# Patient Record
Sex: Male | Born: 1950
Health system: Southern US, Community
[De-identification: ages and names within clinical notes are randomized; demographics above are authoritative.]

## PROBLEM LIST (undated history)

## (undated) DIAGNOSIS — E785 Hyperlipidemia, unspecified: Secondary | ICD-10-CM

## (undated) DIAGNOSIS — I639 Cerebral infarction, unspecified: Secondary | ICD-10-CM

## (undated) DIAGNOSIS — I2510 Atherosclerotic heart disease of native coronary artery without angina pectoris: Secondary | ICD-10-CM

## (undated) DIAGNOSIS — E782 Mixed hyperlipidemia: Secondary | ICD-10-CM

## (undated) DIAGNOSIS — I10 Essential (primary) hypertension: Secondary | ICD-10-CM

## (undated) DIAGNOSIS — Z720 Tobacco use: Secondary | ICD-10-CM

## (undated) DIAGNOSIS — I219 Acute myocardial infarction, unspecified: Secondary | ICD-10-CM

## (undated) DIAGNOSIS — I779 Disorder of arteries and arterioles, unspecified: Secondary | ICD-10-CM

## (undated) DIAGNOSIS — I679 Cerebrovascular disease, unspecified: Secondary | ICD-10-CM

## (undated) HISTORY — DX: Hyperlipidemia, unspecified: E78.5

## (undated) HISTORY — PX: CAROTID ENDARTERECTOMY: SUR193

## (undated) HISTORY — DX: Essential (primary) hypertension: I10

## (undated) HISTORY — DX: Cerebral infarction, unspecified: I63.9

## (undated) HISTORY — DX: Disorder of arteries and arterioles, unspecified: I77.9

## (undated) HISTORY — DX: Cerebrovascular disease, unspecified: I67.9

## (undated) HISTORY — DX: Acute myocardial infarction, unspecified: I21.9

## (undated) HISTORY — DX: Tobacco use: Z72.0

## (undated) HISTORY — DX: Mixed hyperlipidemia: E78.2

## (undated) HISTORY — DX: Atherosclerotic heart disease of native coronary artery without angina pectoris: I25.10

---

## 2002-01-05 ENCOUNTER — Ambulatory Visit (HOSPITAL_BASED_OUTPATIENT_CLINIC_OR_DEPARTMENT_OTHER): Admission: RE | Admit: 2002-01-05 | Discharge: 2002-01-05 | Payer: Self-pay | Admitting: *Deleted

## 2003-08-11 ENCOUNTER — Ambulatory Visit (HOSPITAL_COMMUNITY): Admission: RE | Admit: 2003-08-11 | Discharge: 2003-08-11 | Payer: Self-pay | Admitting: Family Medicine

## 2003-08-16 ENCOUNTER — Ambulatory Visit (HOSPITAL_COMMUNITY): Admission: RE | Admit: 2003-08-16 | Discharge: 2003-08-16 | Payer: Self-pay | Admitting: Family Medicine

## 2003-08-28 ENCOUNTER — Inpatient Hospital Stay (HOSPITAL_COMMUNITY): Admission: AD | Admit: 2003-08-28 | Discharge: 2003-08-31 | Payer: Self-pay | Admitting: Internal Medicine

## 2003-09-28 ENCOUNTER — Inpatient Hospital Stay (HOSPITAL_COMMUNITY): Admission: RE | Admit: 2003-09-28 | Discharge: 2003-09-29 | Payer: Self-pay | Admitting: Vascular Surgery

## 2003-09-28 ENCOUNTER — Encounter (INDEPENDENT_AMBULATORY_CARE_PROVIDER_SITE_OTHER): Payer: Self-pay | Admitting: *Deleted

## 2003-10-31 ENCOUNTER — Observation Stay (HOSPITAL_COMMUNITY): Admission: RE | Admit: 2003-10-31 | Discharge: 2003-11-01 | Payer: Self-pay | Admitting: Vascular Surgery

## 2003-10-31 ENCOUNTER — Encounter (INDEPENDENT_AMBULATORY_CARE_PROVIDER_SITE_OTHER): Payer: Self-pay | Admitting: Specialist

## 2004-08-15 ENCOUNTER — Ambulatory Visit (HOSPITAL_COMMUNITY): Admission: RE | Admit: 2004-08-15 | Discharge: 2004-08-15 | Payer: Self-pay | Admitting: Family Medicine

## 2004-09-10 ENCOUNTER — Ambulatory Visit (HOSPITAL_COMMUNITY): Admission: RE | Admit: 2004-09-10 | Discharge: 2004-09-10 | Payer: Self-pay | Admitting: Orthopedic Surgery

## 2005-05-28 ENCOUNTER — Ambulatory Visit: Payer: Self-pay | Admitting: Cardiology

## 2005-05-28 ENCOUNTER — Inpatient Hospital Stay (HOSPITAL_COMMUNITY): Admission: EM | Admit: 2005-05-28 | Discharge: 2005-05-31 | Payer: Self-pay | Admitting: Cardiology

## 2005-05-29 ENCOUNTER — Ambulatory Visit: Payer: Self-pay | Admitting: Cardiology

## 2005-07-14 ENCOUNTER — Ambulatory Visit: Payer: Self-pay | Admitting: Cardiology

## 2005-09-13 ENCOUNTER — Emergency Department (HOSPITAL_COMMUNITY): Admission: EM | Admit: 2005-09-13 | Discharge: 2005-09-13 | Payer: Self-pay | Admitting: Emergency Medicine

## 2005-10-24 ENCOUNTER — Ambulatory Visit (HOSPITAL_COMMUNITY): Admission: RE | Admit: 2005-10-24 | Discharge: 2005-10-24 | Payer: Self-pay | Admitting: Family Medicine

## 2006-02-26 ENCOUNTER — Ambulatory Visit (HOSPITAL_COMMUNITY): Admission: RE | Admit: 2006-02-26 | Discharge: 2006-02-26 | Payer: Self-pay | Admitting: Family Medicine

## 2006-07-22 ENCOUNTER — Ambulatory Visit: Payer: Self-pay | Admitting: Physician Assistant

## 2006-07-31 ENCOUNTER — Ambulatory Visit: Payer: Self-pay | Admitting: Cardiology

## 2007-04-16 ENCOUNTER — Ambulatory Visit (HOSPITAL_COMMUNITY): Admission: RE | Admit: 2007-04-16 | Discharge: 2007-04-16 | Payer: Self-pay | Admitting: Family Medicine

## 2007-06-01 ENCOUNTER — Encounter: Payer: Self-pay | Admitting: Physician Assistant

## 2007-06-01 ENCOUNTER — Ambulatory Visit: Payer: Self-pay | Admitting: Cardiology

## 2007-06-10 ENCOUNTER — Encounter: Payer: Self-pay | Admitting: Cardiology

## 2008-05-10 ENCOUNTER — Ambulatory Visit: Payer: Self-pay | Admitting: Cardiology

## 2008-05-10 ENCOUNTER — Encounter: Payer: Self-pay | Admitting: Physician Assistant

## 2008-05-25 ENCOUNTER — Encounter: Payer: Self-pay | Admitting: Physician Assistant

## 2008-10-02 ENCOUNTER — Encounter: Payer: Self-pay | Admitting: Cardiology

## 2008-10-02 ENCOUNTER — Inpatient Hospital Stay (HOSPITAL_COMMUNITY): Admission: EM | Admit: 2008-10-02 | Discharge: 2008-10-06 | Payer: Self-pay | Admitting: Internal Medicine

## 2008-10-02 ENCOUNTER — Ambulatory Visit: Payer: Self-pay | Admitting: Cardiovascular Disease

## 2008-10-03 ENCOUNTER — Encounter: Payer: Self-pay | Admitting: Cardiology

## 2008-10-03 ENCOUNTER — Encounter (INDEPENDENT_AMBULATORY_CARE_PROVIDER_SITE_OTHER): Payer: Self-pay | Admitting: *Deleted

## 2008-10-04 ENCOUNTER — Ambulatory Visit: Payer: Self-pay | Admitting: Vascular Surgery

## 2008-10-05 ENCOUNTER — Encounter: Payer: Self-pay | Admitting: Vascular Surgery

## 2008-10-05 ENCOUNTER — Encounter: Payer: Self-pay | Admitting: Cardiology

## 2008-10-24 ENCOUNTER — Ambulatory Visit: Payer: Self-pay | Admitting: Vascular Surgery

## 2009-01-11 DIAGNOSIS — I10 Essential (primary) hypertension: Secondary | ICD-10-CM | POA: Insufficient documentation

## 2009-01-11 DIAGNOSIS — I2510 Atherosclerotic heart disease of native coronary artery without angina pectoris: Secondary | ICD-10-CM

## 2009-01-11 DIAGNOSIS — E785 Hyperlipidemia, unspecified: Secondary | ICD-10-CM | POA: Insufficient documentation

## 2009-06-22 ENCOUNTER — Ambulatory Visit: Payer: Self-pay | Admitting: Cardiology

## 2009-06-22 DIAGNOSIS — I679 Cerebrovascular disease, unspecified: Secondary | ICD-10-CM

## 2009-06-22 DIAGNOSIS — F17200 Nicotine dependence, unspecified, uncomplicated: Secondary | ICD-10-CM

## 2010-05-27 ENCOUNTER — Encounter: Payer: Self-pay | Admitting: Interventional Radiology

## 2010-06-06 NOTE — Assessment & Plan Note (Signed)
Summary: FOLLOW UP DUE SINCE 07/10-SRS   Visit Type:  Follow-up Primary Provider:  Robbie Lis medical  CC:  follow-up visit.  History of Present Illness: the patient is a 60 year old male, [place] veteran with PTSD. The patient has a history of coronary artery disease and is status post aborted inferior wall myocardial infarction although bare-metal stenting of a subtotal proximal RCA in January of 2007. He had furthermore residual nonobstructive coronary artery disease. The patient denies any chest pain. He denies any short of breath orthopnea PND. Unfortunately he continues to smoke. The patient has significant pain in the lower extremities and back related to degenerative joint disease. His symptoms are consistent with pseudo-claudication: His pain in his legs improved with walking but are worse with sitting and resting.  The patient also underwent carotid endarterectomy last year of the right carotid artery.  he has a subtotal occlusion of the left carotid artery. He is followed by vascular surgery  Clinical Review Panels:  CXR CXR results The cardiomediastinal silhouette is unremarkable.         The lungs are clear.         No evidence of focal airspace disease, pleural effusions, or         pneumothorax.         No acute bony abnormalities are identified.                   IMPRESSION:         No evidence of acute cardiopulmonary disease. (10/02/2008)  Echocardiogram Echocardiogram  1. Left ventricle: The cavity size was normal. Systolic function was      normal. The estimated ejection fraction was in the range of 60%      to 65%. Wall motion was normal; there were no regional wall      motion abnormalities.   2. Mitral valve: Calcified annulus. Mildly thickened leaflets .   Echocardiography. M-mode, complete 2D, spectral Doppler, and color   Doppler. Height: Height: 170cm. Height: 66.9in. Weight: Weight:   80kg. Weight: 176lb. Body mass index: BMI: 27.7kg/m^2. Body surface  area: BSA: 1.67m^2. Patient status: Inpatient. Location: ICU/CCU (10/03/2008)  Carotid Studies Carotid Doppler Results RIGHT CAROTID ARTERY: Moderate Bromley soft intimal thickening is present in the bulb.  Low resistance Doppler wave form with sharp         upstroke in the internal.                   RIGHT VERTEBRAL ARTERY:  Antegrade                   LEFT CAROTID ARTERY: There is an he centric plaque in the distal         common carotid artery.  Moderate intimal thickening in the bulb is         present.  Low resistance Doppler wave form with sharp upstroke in         the internal.                   LEFT VERTEBRAL ARTERY:  Antegrade                   IMPRESSION:         50-70% stenosis in the right and left internal carotid arteries.         Velocities have increased since the prior study. (05/25/2008)    Preventive Screening-Counseling & Management  Alcohol-Tobacco     Smoking Status: current  Smoking Cessation Counseling: yes     Packs/Day: 1PPD  Current Medications (verified): 1)  Enalapril Maleate 20 Mg Tabs (Enalapril Maleate) .... Take 1 Tablet By Mouth Once A Day 2)  Simvastatin 80 Mg Tabs (Simvastatin) .... Take 1 Tab By Mouth At Bedtime 3)  Metoprolol Succinate 25 Mg Xr24h-Tab (Metoprolol Succinate) .... Take 1 Tablet By Mouth Once A Day 4)  Aspir-Trin 325 Mg Tbec (Aspirin) .... Take 1 Tablet By Mouth Once A Day 5)  Lexapro 20 Mg Tabs (Escitalopram Oxalate) .... Take 1 Tablet By Mouth Once A Day  Allergies (verified): 1)  ! Codeine 2)  ! * Dilaudid 3)  ! Phenergan  Comments:  Nurse/Medical Assistant: The patient's medications and allergies were reviewed with the patient and were updated in the Medication and Allergy Lists. List reviewed.  Past History:  Past Medical History: Last updated: 06/22/2009 HYPERTENSION, UNSPECIFIED (ICD-401.9) HYPERLIPIDEMIA-MIXED (ICD-272.4) CAD, NATIVE VESSEL (ICD-414.01)  Cerebrovascular disease  1. Severe  single-vessel coronary artery disease.     a.     Status post aborted inferior myocardial infarction, followed      by bare-metal stenting of subtotal proximal RCA, January 2007.     b.     Residual nonobstructive coronary artery disease.     c.     EF 50-55%; inferobasal akinesis. 2. Cerebrovascular disease.     a.     Status post prior bilateral carotid endarterectomy, with      residual bilateral bruits.     b.     50-69% right ICA stenosis, February 2009.     c.     Bilateral, proximal ECA occlusion with distal      collateralization. 3. Ongoing tobacco. 4. Dyslipidemia.  Past Surgical History: Last updated: 06/22/2009 Carotid Endarterectomy Redo right carotid endarterectomy with replacement of carotid artery with interposition greater saphenous vein from the left leg.  Family History: Last updated: 01/11/2009 Family History of Coronary Artery Disease:   Social History: Last updated: 01/11/2009 Married  Tobacco Use - Yes.  Alcohol Use - no  Risk Factors: Smoking Status: current (06/22/2009) Packs/Day: 1PPD (06/22/2009)  Social History: Packs/Day:  1PPD  Review of Systems  The patient denies fatigue, malaise, fever, weight gain/loss, vision loss, decreased hearing, hoarseness, chest pain, palpitations, shortness of breath, prolonged cough, wheezing, sleep apnea, coughing up blood, abdominal pain, blood in stool, nausea, vomiting, diarrhea, heartburn, incontinence, blood in urine, muscle weakness, joint pain, leg swelling, rash, skin lesions, headache, fainting, dizziness, depression, anxiety, enlarged lymph nodes, easy bruising or bleeding, and environmental allergies.         insomnia  Vital Signs:  Patient profile:   60 year old male Height:      68 inches Weight:      173 pounds BMI:     26.40 Pulse rate:   58 / minute BP sitting:   100 / 58  (left arm) Cuff size:   regular  Vitals Entered By: Carlye Grippe (June 22, 2009 8:39 AM)  Nutrition  Counseling: Patient's BMI is greater than 25 and therefore counseled on weight management options. CC: follow-up visit   Physical Exam  Additional Exam:  General: Well-developed, well-nourished in no distress head: Normocephalic and atraumatic eyes PERRLA/EOMI intact, conjunctiva and lids normal nose: No deformity or lesions mouth normal dentition, normal posterior pharynx neck: Supple, no JVD.  No masses, thyromegaly or abnormal cervical nodes. Very soft left carotid bruit and a harsher right carotid bruit. lungs: Normal breath sounds bilaterally without wheezing.  Normal percussion heart: regular rate and rhythm with normal S1 and S2, no S3 or S4.  PMI is normal.  No pathological murmurs abdomen: Normal bowel sounds, abdomen is soft and nontender without masses, organomegaly or hernias noted.  No hepatosplenomegaly musculoskeletal: Back normal, normal gait muscle strength and tone normal pulsus: Pulse is normal in all 4 extremities Extremities: No peripheral pitting edema neurologic: Alert and oriented x 3 skin: Intact without lesions or rashes cervical nodes: No significant adenopathy psychologic: Normal affect    Impression & Recommendations:  Problem # 1:  CAD, NATIVE VESSEL (ICD-414.01) the patient reports no recurrent symptoms of chest pain. I did tell him that he is due for a surveillance Cardiolite study but the patient stating that he is unable to afford this. I told him that we would continue to monitor him closely clinically. I also counseled him regarding his tobacco use and reinforcing compliance with his medications. The following medications were removed from the medication list:    Plavix 75 Mg Tabs (Clopidogrel bisulfate) .Marland Kitchen... Take 1 tablet by mouth once a day His updated medication list for this problem includes:    Enalapril Maleate 20 Mg Tabs (Enalapril maleate) .Marland Kitchen... Take 1 tablet by mouth once a day    Metoprolol Succinate 25 Mg Xr24h-tab (Metoprolol  succinate) .Marland Kitchen... Take 1 tablet by mouth once a day    Aspir-trin 325 Mg Tbec (Aspirin) .Marland Kitchen... Take 1 tablet by mouth once a day  Problem # 2:  TOBACCO ABUSE (ICD-305.1) the patient was extensively counseled regarding his tobacco use  Problem # 3:  CEREBROVASCULAR DISEASE (ICD-437.9) followed by vascular surgery the patient is status post recent right carotid endarterectomy.  Problem # 4:  HYPERTENSION, UNSPECIFIED (ICD-401.9) blood pressure is well controlled. His updated medication list for this problem includes:    Enalapril Maleate 20 Mg Tabs (Enalapril maleate) .Marland Kitchen... Take 1 tablet by mouth once a day    Metoprolol Succinate 25 Mg Xr24h-tab (Metoprolol succinate) .Marland Kitchen... Take 1 tablet by mouth once a day    Aspir-trin 325 Mg Tbec (Aspirin) .Marland Kitchen... Take 1 tablet by mouth once a day  Problem # 5:  HYPERLIPIDEMIA-MIXED (ICD-272.4) the patient states that he is unable to afford Lipitor and I changed him to simvastatin 80 mg p.o. q.h.s. followup blood work will be with his primary care physician. His updated medication list for this problem includes:    Simvastatin 80 Mg Tabs (Simvastatin) .Marland Kitchen... Take 1 tab by mouth at bedtime  Patient Instructions: 1)  Stop Crestor 2)  Start Simvastatin 80mg  daily 3)  Follow up in  1 year. Prescriptions: SIMVASTATIN 80 MG TABS (SIMVASTATIN) Take 1 tab by mouth at bedtime  #30 x 2   Entered by:   Hoover Brunette, LPN   Authorized by:   Lewayne Bunting, MD, [facility]   Signed by:   Hoover Brunette, LPN on 04/54/0981   Method used:   Electronically to        [facility] # [phone]* (retail)       [address]       [place], [place]  [address]       Ph: [phone] or [phone]       Fax: [phone]   RxID:   [id number]

## 2010-08-12 LAB — DIFFERENTIAL
Eosinophils Absolute: 0.1 10*3/uL (ref 0.0–0.7)
Eosinophils Relative: 1 % (ref 0–5)
Lymphocytes Relative: 17 % (ref 12–46)
Lymphs Abs: 1.5 10*3/uL (ref 0.7–4.0)
Monocytes Absolute: 0.9 10*3/uL (ref 0.1–1.0)
Monocytes Relative: 10 % (ref 3–12)

## 2010-08-12 LAB — DRUGS OF ABUSE SCREEN W/O ALC, ROUTINE URINE
Amphetamine Screen, Ur: NEGATIVE
Benzodiazepines.: NEGATIVE
Creatinine,U: 225.7 mg/dL
Marijuana Metabolite: NEGATIVE
Methadone: NEGATIVE

## 2010-08-12 LAB — BASIC METABOLIC PANEL
BUN: 12 mg/dL (ref 6–23)
CO2: 27 mEq/L (ref 19–32)
Chloride: 103 mEq/L (ref 96–112)
Chloride: 110 mEq/L (ref 96–112)
Creatinine, Ser: 0.74 mg/dL (ref 0.4–1.5)
GFR calc Af Amer: >60 mL/min (ref >60)
GFR calc non Af Amer: >60 mL/min (ref >60)
Potassium: 3.7 mEq/L (ref 3.5–5.1)
Potassium: 3.7 mEq/L (ref 3.5–5.1)
Sodium: 139 mEq/L (ref 135–145)

## 2010-08-12 LAB — CBC
HCT: 38.7 % — ABNORMAL LOW (ref 39.0–52.0)
HCT: 38.8 % — ABNORMAL LOW (ref 39.0–52.0)
HCT: 41.4 % (ref 39.0–52.0)
Hemoglobin: 13.8 g/dL (ref 13.0–17.0)
MCHC: 35.3 g/dL (ref 30.0–36.0)
MCHC: 35.3 g/dL (ref 30.0–36.0)
MCV: 94.8 fL (ref 78.0–100.0)
MCV: 95.5 fL (ref 78.0–100.0)
MCV: 95.7 fL (ref 78.0–100.0)
Platelets: 120 10*3/uL — ABNORMAL LOW (ref 150–400)
RBC: 4.33 MIL/uL (ref 4.22–5.81)
WBC: 8.5 10*3/uL (ref 4.0–10.5)
WBC: 9.2 10*3/uL (ref 4.0–10.5)

## 2010-08-12 LAB — CROSSMATCH: ABO/RH(D): A POS

## 2010-08-12 LAB — GLUCOSE, CAPILLARY
Glucose-Capillary: 128 mg/dL — ABNORMAL HIGH (ref 70–99)
Glucose-Capillary: 138 mg/dL — ABNORMAL HIGH (ref 70–99)
Glucose-Capillary: 78 mg/dL (ref 70–99)
Glucose-Capillary: 81 mg/dL (ref 70–99)
Glucose-Capillary: 84 mg/dL (ref 70–99)
Glucose-Capillary: 98 mg/dL (ref 70–99)

## 2010-08-12 LAB — COMPREHENSIVE METABOLIC PANEL
AST: 19 U/L (ref 0–37)
Albumin: 3.4 g/dL — ABNORMAL LOW (ref 3.5–5.2)
BUN: 11 mg/dL (ref 6–23)
CO2: 29 mEq/L (ref 19–32)
Calcium: 8.7 mg/dL (ref 8.4–10.5)
Chloride: 106 mEq/L (ref 96–112)
Creatinine, Ser: 0.84 mg/dL (ref 0.4–1.5)
GFR calc Af Amer: >60 mL/min (ref >60)
GFR calc non Af Amer: >60 mL/min (ref >60)
Total Bilirubin: 0.6 mg/dL (ref 0.3–1.2)

## 2010-08-12 LAB — OPIATE, QUANTITATIVE, URINE
Codeine Urine: NEGATIVE ng/mL
Morphine, Confirm: 1800 ng/mL

## 2010-08-12 LAB — CK TOTAL AND CKMB (NOT AT ARMC)
CK, MB: 10.4 ng/mL — ABNORMAL HIGH (ref 0.3–4.0)
Total CK: 438 U/L — ABNORMAL HIGH (ref 7–232)
Total CK: 450 U/L — ABNORMAL HIGH (ref 7–232)

## 2010-08-12 LAB — LIPID PANEL
HDL: 26 mg/dL — ABNORMAL LOW (ref >39)
Total CHOL/HDL Ratio: 7 RATIO

## 2010-08-12 LAB — PROTIME-INR
INR: 1 (ref 0.00–1.49)
Prothrombin Time: 13.6 seconds (ref 11.6–15.2)

## 2010-08-12 LAB — TROPONIN I: Troponin I: 0.02 ng/mL (ref 0.00–0.06)

## 2010-08-12 LAB — TSH: TSH: 1.398 u[IU]/mL (ref 0.350–4.500)

## 2010-08-12 LAB — HEMOGLOBIN A1C
Hgb A1c MFr Bld: 5.7 % (ref 4.6–6.1)
Mean Plasma Glucose: 117 mg/dL

## 2010-08-13 LAB — GLUCOSE, CAPILLARY
Glucose-Capillary: 120 mg/dL — ABNORMAL HIGH (ref 70–99)
Glucose-Capillary: 122 mg/dL — ABNORMAL HIGH (ref 70–99)

## 2010-08-13 LAB — CK TOTAL AND CKMB (NOT AT ARMC): Relative Index: 2.8 — ABNORMAL HIGH (ref 0.0–2.5)

## 2010-08-13 LAB — LIPID PANEL
Cholesterol: 208 mg/dL — ABNORMAL HIGH (ref 0–200)
Total CHOL/HDL Ratio: 6.7 RATIO

## 2010-08-13 LAB — HEMOGLOBIN A1C
Hgb A1c MFr Bld: 6 % (ref 4.6–6.1)
Mean Plasma Glucose: 126 mg/dL

## 2010-08-13 LAB — LACTIC ACID, PLASMA: Lactic Acid, Venous: 0.8 mmol/L (ref 0.5–2.2)

## 2010-08-13 LAB — TROPONIN I: Troponin I: 0.01 ng/mL (ref 0.00–0.06)

## 2010-09-17 NOTE — H&P (Signed)
NAMEDEARIS, DANIS                  ACCOUNT NO.:  [id number]   MEDICAL RECORD NO.:  [id number]          PATIENT TYPE:  INP   LOCATION:  4706                         FACILITY:  [facility]   PHYSICIAN:  Acey Lav, MD  DATE OF BIRTH:  01-17-1951   DATE OF ADMISSION:  10/02/2008  DATE OF DISCHARGE:                              HISTORY & PHYSICAL   CHIEF COMPLAINT:  Eye pain, facial pain, double vision now with  expressive aphasia.   HISTORY OF PRESENT ILLNESS:  Mr. Vickerman is a 60 year old Caucasian  gentleman with past medical history significant for bilateral carotid  stenosis status post left carotid endarterectomy with Dacron patch  angioplasty in 2005 and right carotid enterectomy with Dacron patch  angioplasty also in 2005 in May.  Also history coronary artery disease  status post cardiac catheterization and placement of stents, see below,  who presented to the [facility] with complaints of over 2 weeks  of facial pain, frontal headache pain, and pain along his left temporal  area, as well as eye pain.  This had been intermittent in nature but had  increased in severity in the last 24 hours.  He came to the emergency  department at [facility].  While there he had a CT scan  of the head performed which showed complete opacification of left  maxillary sinus with shrinking of the sinus and suspicion  radiographically for something called silent sinus syndrome.  Of note,  the patient had not noted nor his wife noted facial asymmetry or sinking  in of his eye.  However, due to this potential silent sinus syndrome,  the emergency room physician there called me at [facility], and requested transfer to [facility] where he could have  subspecialist care including evaluation by ear, nose and throat surgery.  I arranged for transfer the patient arrived on the floor here at [facility].  Upon examining the patient I found him to have expressive  aphasia.   I talked to his wife and he she stated that he did not have  this problem prior to his coming to the hospital today.  It is unclear  when the expressive aphasia developed.  He was given morphine while in  the emergency department so that they could measure his ocular pressures  and they found his ocular pressure to be normal at 4.  My take is at  this developed after him having been given morphine, although it is hard  to blame morphine for this acute focal neurological deficit as he is  otherwise now currently completely alert and following commands.   PAST MEDICAL HISTORY:  1. Bilateral carotid artery stenosis status post carotid enterectomy      with Dacron patch bilaterally in May and June 2005.  History of      coronary artery disease status post cardiac catheterization most      recently in 2007 followed closely by Dr. Andee Lineman.  He had an      inferior myocardial infarction following bare metal stent placement  to the sub total proximal RCA in January 2007.  He had residual      nonobstructive coronary artery disease present on his cardiac      catheterization 2007.  He had ejection fraction of 50-55% with      inferior basal akinesis.  2. Tobacco.  3. Hyperlipidemia.  4. Hypertension.  5. History of noncompliance with medications.   PAST SURGICAL HISTORY:  Described above, endarterectomy and cardiac  catheterization.   FAMILY HISTORY:  Father died of heart attack at 43.   SOCIAL HISTORY:  The patient is married with 4 children.  Previous  working Holiday representative.  Smoked a pack a day of cigarettes and had done so  30 years.  Did not use alcohol regularly.   REVIEW OF SYSTEMS:  Described above in history present illness,  otherwise negative for fevers or chills.  He has had recent problems  with sinus congestion per his wife all this entire spring.   Otherwise 10-point review systems is negative.   ALLERGIES:  The patient has DILAUDID and PHENERGAN make him  combative.   CURRENT MEDICATIONS:  1. Plavix 75 mg daily.  2. Aspirin 325 mg daily.  3. Lipitor 80 mg daily.  4. Enalapril 20 mg daily.  5. Lexapro unknown dosage.   PHYSICAL EXAMINATION:  VITAL SIGNS:  Blood pressure 138/68, pulse 52,  respirations 18, pulse ox 98% room air, temperature 97.9.  GENERAL:  The patient is alert but has obvious expressive aphasia.  HEENT:  He does not have tenderness over frontal or maxillary sinuses or  any of the sinuses bilaterally.  Pupils are equal, round, reactive to  light.  Sclerae anicteric.  There is no obvious enophthalmus present on  my exam.  Oropharynx is clear without exudate or lesions.  NECK:  He has carotid atherectomy scar on the left.  Cannot appreciate  bruits bilaterally.  CARDIOVASCULAR:  Regular rate and rhythm.  No murmurs, gallops or rubs.  LUNGS:  Clear to auscultation bilaterally.  No wheezes, rhonchi rales.  ABDOMEN:  Soft, nondistended, nontender.  EXTREMITIES: No edema.  NEUROLOGIC:  His cranial nerves II-XII appear grossly intact.  His upper  extremities and lower extremity strength is 5/5.  Sensation is intact.  His exam was notable for expressive aphasia.  He could follow commands  quite clearly when prompted with questions such as are you taking  Plavix, he could say yes.  He would spontaneously speak at times and  say, I have got to tell you something doc, but he would not be able to  further elaborate.  We are trying to ascertain exactly when the onset of  this was.   LABORATORY DATA:  EKG done at [facility] shows sinus  rhythm although the tracing is not optimal, looking at it I cannot  discern any acute ST or T-wave changes but the tracing is really poor.   LABORATORY DATA:  Does not have all labs here but his BUN was 15,  creatinine was 0.95, chloride was 104, bicarbonate was 21, CPK-MB was  9.1.  CK was 357.  His glucose was 156.  His potassium was 3.5.  His CBC  was significant for white  blood cell count of 13,000.  His hemoglobin  was 16.5, hematocrit 47.3.  His platelets were 193,000.  His ANC was  8.8.  His sed rate was 5.  Of note sodium was 137.  Troponin was  negative.   Head CT as described above.   ASSESSMENT/PLAN:  This is a 60 year old Caucasian gentleman with known  carotid artery stenosis, coronary artery disease, hypertension,  hyperlipidemia, acute onset of left-sided facial pain, eye pain, visual  problems with a CT scan initially showing the left maxillary occasion  and shrinkage of the left maxillary sinus concerning for silent sinus  syndrome.  Now patient with acute expressive aphasia.   PROBLEM LIST:  1. Acute expressive aphasia:  Were are trying to ascertain exactly      when this began.  We are working with the stroke team.  I am going      to get a stat CT without contrast of the head right now.  I will      continue on aspirin, Plavix, Lipitor and allow him to have      permissive hypertension, although he is not terribly hypertensive      at this point in time.  I am going to keep him n.p.o. and have a      bedside swallow evaluation, repeat carotid Dopplers.  Likely going      to get MRI of the brain once we get the CT imaging back.  Suspicion      for a stroke is high given the acute nature of this focal finding.      We will talk to neurology and code stroke team.  2. Left sided facial pain and eye pain with findings on CT scans      concerning for a silent sinus syndrome:  This is an entity which      I had never heard of until today.  I did talk to Dr. Ezzard Standing from      ENT surgery and he was not familiar with this diagnosis as well.      Initially reading a review of from [facility] et al found in the journal      IMAJ from May 2005, Volume 7 describes this disease.  Treatment      usually involves extensive surgery that is usually done in a staged      manner.  They recommend having the sinuses be clear to allow for      functional drainage.   Certainly possible is the patient could have      a bacterial sinusitis.  I will put him on her high-dose Rocephin at      2 grams daily along with Flagyl 500 mg 3 times daily.  I am going      to touch base again with ENT and a we may need to move this patient      ultimately to another tertiary care center so he can ultimately      receive surgery if he indeed does have some silent sinus syndrome.      In the interim I am going to treat for bacterial sinusitis      possibility.  3. Coronary artery disease.  I will continue on Plavix, aspirin,      Lipitor, and reinstitute enalapril once he is out of the window of      this stroke-like syndromes.  4. Depression.  I will continue on Lexapro when he can swallow      properly.  5. Prophylaxis. The patient be on heparin 5000 t.i.d.  6. Code Status.  The patient is Full Code.      Acey Lav, MD  Electronically Signed     CV/MEDQ  D:  10/02/2008  T:  10/02/2008  Job:  [id number]  cc:   Learta Codding, MD,FACC  Dr. Demetrio Lapping E. Ezzard Standing, M.D.

## 2010-09-17 NOTE — Assessment & Plan Note (Signed)
[facility]                          [facility] OFFICE NOTE   [place], PERRA                         MRN:          [id number]  DATE:06/01/2007                            DOB:          10-May-1950    CARDIOLOGIST:  Dr. Lewayne Bunting.   PRIMARY CARE PHYSICIAN:  Dr. Nobie Putnam at [facility] in  [place].   REASON FOR VISIT:  Nine month followup.   HISTORY OF PRESENT ILLNESS:  Edward [name] is a 60 year old male patient with  a history of coronary artery disease, status post inferior wall  myocardial infarction in January of 2007, treated with a bare-metal  stent to the RCA.  He returns to the office today for followup.  Unfortunately, he has a herniated nucleus pulposis, and he will need  surgery in the near future.  He has been dealing with chronic back pain  for quite some time.  He tells me that he is having no chest pain.  He  denies any exertional chest heaviness or tightness.  Denies any  exertional shortness of breath.  He denies orthopnea, PND or pedal  edema.  He denies syncope.   CURRENT MEDICATIONS:  1. Lipitor 80 mg nightly.  2. Aspirin 325 mg daily.  3. Lexapro 10 mg daily.  4. Plavix 75 mg daily.  5. Enalapril 10 mg daily.  6. Hydrocodone/APAP 7.5/650 mg nightly.  7. Cyclobenzaprine 10 mg daily.  8. Nitroglycerin p.r.n. chest pain.   ALLERGIES:  He is allergic to 1 type of PAIN MEDICATION, but he cannot  remember the name.   SOCIAL HISTORY:  He continues to smoke cigarettes.   REVIEW OF SYSTEMS:  Please see HPI.  The rest of the review of systems  are negative.   PHYSICAL EXAMINATION:  He is a well-nourished, well-developed male in no  distress.  Blood pressure 140/73, pulse 68, weight 179.6 pounds.  HEENT:  Normal.  NECK:  Without JVD.  LYMPH:  Without lymphadenopathy.  ENDOCRINE:  Without thyromegaly.  CARDIAC:  S1, S2, regular rate and rhythm without murmurs.  LUNGS:  Clear to auscultation bilaterally  without wheezing, rhonchi, or  rales.  ABDOMEN:  Soft, nontender with normoactive bowel sounds, no  organomegaly.  EXTREMITIES:  Without edema.  Calves are soft, nontender.  SKIN:  Warm and dry.  NEUROLOGIC:  He is alert and oriented x3.  Cranial nerves II-XII are  grossly intact.   Electrocardiogram reveals sinus rhythm, heart rate of 66, leftward axis,  nonspecific ST-T wave changes, no significant change to the previous  tracings.   IMPRESSION:  1. Coronary artery disease.      a.     Inferior wall myocardial infarction, January of 2007,       treated with a bare-metal stent to the proximal right coronary       artery.      b.     Residual coronary artery disease:  40% mid left anterior       descending stenosis, circumflex 20% ostial, 25% mid, 3rd obtuse       marginal 20%, right ventricular  marginal branch 50%, 40% distal       right coronary artery.  2. Good left ventricular function.  3. Cerebrovascular disease, bilateral carotid endarterectomies.  4. Lumbar degenerative disk disease, needs upcoming surgery.  5. Ongoing tobacco abuse.  6. Hypertension.  7. Hyperlipidemia.   DISCUSSION:  Edward [name] returns to the office today for followup.  From a  cardiovascular standpoint he is doing quite well.  He is having no  symptoms suggestive of angina.  He does need upcoming back surgery.  It  has been 2 years since his bare-metal stent was placed, and he had  residual nonobstructive disease elsewhere.   PLAN:  1. According to ACC/AHA guidelines, he requires no further cardiac      workup prior to his non-cardiac surgery and should be at acceptable      risk.  Our service will certainly be available in the perioperative      period as necessary.  2. It should be safe for him to come off of Plavix for his upcoming      surgery.  If at all possible, it would be optimal for him to remain      on aspirin throughout the perioperative period.  I have recommended      he decrease he  aspirin to 81 mg daily.  3. He needs better blood pressure control, and his enalapril will be      increased to 20 mg a day.  He will have a followup BMET in 7 to 10      days.  4. It sounds as though his carotids have not been checked in 2 years      or more, therefore, we      will set him up for routine carotid Dopplers to reassess his      coronary artery disease.  5. He will follow up with [place] in 6 months or sooner p.r.n.      Tereso Newcomer, PA-C  Electronically Signed      Learta Codding, MD,FACC  Electronically Signed   SW/MedQ  DD: 06/01/2007  DT: 06/02/2007  Job #: [id number]   cc:   [name], M.D.

## 2010-09-17 NOTE — Consult Note (Signed)
NAME:  Edward [name], Edward [name]                  ACCOUNT NO.:  [id number]   MEDICAL RECORD NO.:  [id number]          PATIENT TYPE:  INP   LOCATION:  3016                         FACILITY:  [facility]   PHYSICIAN:  [name], M.D.DATE OF BIRTH:  1951/01/25   DATE OF CONSULTATION:  10/04/2008  DATE OF DISCHARGE:                                 CONSULTATION   REASON FOR CONSULTATION:  Recurrent right carotid stenosis with an  occluded left carotid artery.   HISTORY:  This is a 60 year old gentleman who was admitted on Oct 02, 2008, with some pain in his left eye and occasional episodes of cloudy  vision in the left eye.  He states that these symptoms had been going on  for approximately 2 weeks.  He had also had some problems with double  vision.  He presented to the emergency department and he had received  some intravenous pain medicine.  His blood pressure dropped transiently  and he was then noted to have an expressive aphasia.  He was admitted to  workup his eye pain and his new onset expressive aphasia.   This patient had undergone stage bilateral carotid endarterectomies in  2005 for high-grade bilateral carotid stenoses.  He had had a right  carotid endarterectomy in May 2005 and subsequent left carotid  endarterectomy in June 2005.  Since that time, he denies any history of  stroke, TIAs, expressive or receptive aphasia, or amaurosis fugax.  He  had been on aspirin.  Of note, this patient has routinely followed at 6  months to yearly intervals, but he was lost to follow up after his  second operation in June.   During this admission, he has undergone an extensive workup, and with  respect to his eye pain, he has been evaluated by Dr. Ezzard Standing who feels  that he has a chronic left maxillary sinus obstruction and will  eventually need elective septoplasty and left maxillary osteotomy to  relieve his sinus obstruction.  With respect to his new onset expressive  aphasia, he has  undergone an extensive workup.  His MRI showed evidence  of bilateral acute and subacute frontal and parietal strokes.  He has  undergone a carotid duplex which showed that his left common carotid  artery was occluded on the right side, based on his velocities, had  evidence of an 80% recurrent stenosis on the right.  He did undergo a  cerebral arteriogram which I have reviewed with Dr. Corliss Skains and this  shows an occluded left common carotid artery and by strict NASCET  criteria, a probably 60% recurrent right carotid stenosis above the  patch.  However, I suspect that this stenosis is tighter based on the  velocities seen on the duplex.  He has also had an echo which did not  show any cardiac source for embolization.   PAST MEDICAL HISTORY:  Significant for hypertension, hyperlipidemia,  history of noncompliance with medications, history of tobacco abuse.  He  denies any history of diabetes.  He does have a history of coronary  artery disease and has had cardiac catheterization  in 2007, he is  followed by Dr. Lewayne Bunting.  He has had no recent cardiac symptoms.  He  denies any history of COPD.   SOCIAL HISTORY:  He is married.  He has 1 child.  He smokes 1 to 1-1/2  packs per day of cigarettes, although he states that he is trying to  quit.   REVIEW OF SYSTEMS:  GENERAL:  He has had no recent fever or chills.  He  denies any significant weight loss or weight gain.  CARDIAC:  He has had  no chest pain, chest pressure, palpitations, or arrhythmias.  PULMONARY:  He has had no productive cough, bronchitis, asthma, or wheezing.  GU:  He has had no dysuria or frequency.  GI:  He has had no change in his  bowel habits or history of peptic ulcer disease.  NEURO:  He has had no  dizziness, blackouts, headaches, or seizures.  His expressive dysphasia  has improved.  Hematologic:  He has had no bleeding problems or clotting  disorders.   MEDICATIONS AND ALLERGIES:  Documented on his admission  history and  physical.   PHYSICAL EXAMINATION:  GENERAL:  This is a pleasant 60 year old  gentleman who appears his stated age.  VITAL SIGNS:  His blood pressure is 135/68, heart rate is 52,  temperature 97.9.  HEENT:  Unremarkable.  NECK:  Supple.  He has bilateral carotid bruits.  LUNGS:  Clear bilaterally to auscultation.  CARDIAC:  He has a regular rate and rhythm.  ABDOMEN:  Soft and nontender.  I cannot palpate an aneurysm.  EXTREMITIES:  He has palpable femoral, popliteal, and pedal pulses  bilaterally.  He has no significant lower extremity swelling.  He has  good strength in his upper extremities and lower extremities  bilaterally.  NEUROLOGIC:  He does have a slight expressive dysphasia.   LABORATORY EVALUATION:  A white count of 10.7.  His creatinine is 0.74.  His coags are normal.   IMPRESSION:  This patient presents with bilateral acute and subacute  infarcts related to his left common carotid artery occlusion and  recurrent right internal carotid artery stenosis.  Although the  angiogram suggests only a 60% stenosis by NASCET criteria, I suspect  this underestimates the velocities by duplex suggest this is closer to  80%.  Regardless, I have discussed the case with Dr. Pearlean Brownie who agrees  that this is hemodynamically significant and certainly could be  responsible for his symptoms.  I have recommended redo right carotid  endarterectomy in order to lower his risk of future stroke.  Given his  sinus infection, we will do this ideally with vein patch or replacement  and remove the Dacron patch if possible.  Reviewing his previous OP  note, it appeared that the plaque was quite extensive and I had to  extend the patch quite low on the common carotid artery and also fairly  high.  I have also discussed the case with Dr. Ezzard Standing.  I think they  would be reluctant to proceed with sinus surgery before the carotid  stenosis is addressed.  I have discussed the timing of ENT  surgery with  Dr. Ezzard Standing and our plan will be to keep him on intravenous antibiotics  perioperatively and then hopefully he can be discharged in 1-2 days  after his redo right carotid endarterectomy on p.o. antibiotics with  plans for Dr. Ezzard Standing to perform surgery in 2-3 weeks electively.  I have  discussed the indications for surgery and  the potential complications  including but not limited to bleeding, stroke (periprocedural risk 2%),  nerve injury, MI, or other unpredictable medical problems.  He  understands because it is a redo operation there is a high risk of nerve  injury and bleeding.  All of his questions were answered and he is  agreeable to proceed.  His surgery has been scheduled for tomorrow.      [name], M.D.  Electronically Signed     CSD/MEDQ  D:  10/04/2008  T:  10/05/2008  Job:  [id number]   cc:   Pramod P. Pearlean Brownie, MD  Kristine Garbe Ezzard Standing, M.D.  Acey Lav, MD  Learta Codding, MD,FACC

## 2010-09-17 NOTE — Assessment & Plan Note (Signed)
OFFICE VISIT   [name], Edward S  DOB:  1950/06/17                                       10/24/2008  ZOXWR#:[id number]   I saw this patient in the office today for follow-up after his recent  redo right carotid endarterectomy.  This is a 60 year old gentleman who  was admitted on Oct 02, 2008, with left eye pain and cloudy vision.  During his admission, he developed an acute expressive aphasia.  He  underwent an extensive workup including a carotid duplex scan which  showed evidence of an 80% recurrent right carotid stenosis.  The left  carotid had known occlusion.  Redo right carotid endarterectomy was  recommended in order to lower his risk of future stroke.  I discussed  the case with Dr. Ezzard Standing and it  was felt that the surgery for a chronic  left maxillary sinus destruction can be done on a later date electively.  I did however elected to use a vein in his reconstruction because of the  risk of using prosthetic graft in the face of potential infection.  The  patient actually required replacement of the carotid artery with an  interposition greater saphenous vein graft from the left leg as he had  extensive disease which did not endarterectomize well.  However, he did  well postoperatively and returns for his first outpatient visit.  He had  no focal weakness or paresthesias.  He has had no chest pain or chest  pressure.  He had been no further visual problems.   PHYSICAL EXAMINATION:  This is a pleasant 60 year old gentleman who  appears his stated age.  His blood pressure is 114/65, heart rate is 82.  His right neck incision and the left thigh incisions have all healed  nicely.  Lungs are clear bilaterally to auscultation.  Neurologic exam  is nonfocal.   Overall, I am pleased with his progress and I will see him back in 6  months for follow-up carotid duplex scan.  He knows to call sooner if he  has problems.  In the meantime, he will remain on his  aspirin.  From my  standpoint, if he does require maxillary sinus surgery,  it is okay to  proceed from my standpoint.   [name], M.D.  Electronically Signed   CSD/MEDQ  D:  10/24/2008  T:  10/25/2008  Job:  [id number]   cc:   Pramod P. Pearlean Brownie, MD  Kristine Garbe Ezzard Standing, M.D.  Acey Lav, MD

## 2010-09-17 NOTE — Consult Note (Signed)
NAME:  Edward [name], Edward [name]                  ACCOUNT NO.:  [id number]   MEDICAL RECORD NO.:  [id number]          PATIENT TYPE:  INP   LOCATION:  3108                         FACILITY:  [facility]   PHYSICIAN:  Casimiro Needle L. Reynolds, M.D.DATE OF BIRTH:  [birth year]   DATE OF CONSULTATION:  10/02/2008  DATE OF DISCHARGE:                                 CONSULTATION   REQUESTING PHYSICIAN:  Acey Lav, MD   REASON FOR EVALUATION:  Aphasia.   HISTORY OF PRESENT ILLNESS:  This is the initial inpatient consultation  and evaluation of this 60 year old man with a past medical history,  which includes hypertension, hyperlipidemia, tobacco abuse, and known  history of coronary artery disease, status post bilateral carotid  endarterectomies per Dr. Edilia Bo in 2005.  The patient has had a  progressive problem with left-sided headaches over the past 3 weeks,  which eventually brought him to the emergency department at [facility] today.  He had a CT of the head, and was diagnosed with silent  sinus syndrome.  For his pain, he received intravenous morphine.  At  some point after that, his wife noted that he was not speaking normally.  At that point, transfer was arranged to the hospital service at [facility] for further evaluation and for neurologic consultation.  Upon arrival here, the patient has been more able to speak, but he still  has a definite hesitancy to his speech.  He is otherwise awake and alert  and denies any symptoms except for his ongoing left-sided headache.  He  says for the last several weeks, he has had intermittent spells of which  curtain will pass in front of his left eye, and along with that he  will feel transiently weak in the left arm as if the left arm is clumsy  and cannot be controlled.  The most recent episode that happened today.  He says he has not had a stroke to his knowledge in the past.  He denies  any chest pain or shortness of breath.  He does have  known residual  carotid disease for which he has been followed to [facility].   PAST MEDICAL HISTORY:  Remarkable for the carotid disease as above.  He  also has a history of coronary artery disease, with inferior MRI in  2007.  He is followed by [facility], Dr. Lewayne Bunting.  He is  treated for hypertension and hyperlipidemia, reportedly has a history of  noncompliance of medications.   FAMILY/SOCIAL/REVIEW OF SYSTEMS:  Per admission H and P by Dr. Daiva Eves.  Remarkable particularly for premature coronary artery disease in his  father, who died of MI in 49.  He continues to smoke.   MEDICATIONS AT THE TIME OF ADMISSION:  1. Plavix.  2. Aspirin 325 mg daily.  3. Lipitor 80 mg daily.  4. Enalapril.  5. Lexapro, unknown dosage.   PHYSICAL EXAMINATION:  VITAL SIGNS:  Temperature 97.9, blood pressure  135/68, pulse 52, respirations 18, and O2 sat 96% on room air.  GENERAL:  This is a healthy-appearing man,  supine in hospital bed, in no  evident distress, looking older than his stated age.  HEAD:  Cranium is normocephalic and atraumatic.  Oropharynx benign.  NECK:  Supple.  There is a loud right carotid and supraclavicular bruit.  HEART:  Regular rate and rhythm with a soft systolic murmur.  NEUROLOGIC:  Mental status, he is awake and alert.  He is oriented to  time and place.  He is able to follow 1 or 2 step commands.  There is a  clear slowness to his speech.  He is able to speak clearly, but has  definite word-finding difficulties.  Nevertheless, he is able to name  objects, has a little difficulty repeating a complex phrase.  Cranial  nerves, pupils are equal and briskly reactive.  Extraocular movements  are full without nystagmus.  Visual fields are full to confrontation.  Hearing is intact to conversational speech.  Face, tongue, and palate  move normally and symmetrically.  Motor, normal bulk and tone.  Normal  strength in all tested extremity muscles.  Sensation,  he reports  diminished pinprick sensation over the left hand, otherwise intact  pinprick and double stimulation in all extremities.  Coordination,  finger-to-nose performed accurately.  Gait is deferred.  Reflexes are 2+  and symmetric.  Toes are downgoing bilaterally.   LABORATORY REVIEW:  He had labs done earlier today at [facility].  White count is elevated at 13.3, hemoglobin and platelets are normal.  Chemistries are unremarkable, except for glucose of 156.  CT of the head  demonstrates opacification of the left maxillary sinus with collapse and  inferior bowing of the left orbital floor.  These findings are  consistent with silent sinus syndrome.  There is no acute intracranial  abnormality.   IMPRESSION:  1. Transient aphasia.  Probably, related to hypotension secondary to      medications, not particularly morphine, and the patient with      cerebrovascular disease by history.  This is better, but it does      not appear to be resolved.  2. History of very suggestive of right brain transient ischemic      attack, it is recently yesterday, question secondary to right      carotid or innominate disease.  3. Silent sinus syndrome with resultant headaches.   RECOMMENDATIONS:  Maintain bed rest with head of bed 15-30 degrees.  Normal saline at 100 mL an hour and aggressive hydration.  Avoid  hypotension.  Aspirin and Plavix once swallow is cleared.  Oxygen  for  saturation greater than 94%.  We will proceed with MRI of the brain and  MRA of the head and neck.  Stroke service to follow.   ADDENDUM   MRI/MRA is performed urgently this evening.  The MRA demonstrates  bilateral small subcortical strokes in the watershed distribution  between the ACA and MCA territories, more so on the left.  The MRA  demonstrates left carotid occlusion versus very slow flow and appears to  be a right carotid stenosis in the proximal ICA.  We will recommend that  he should be kept n.p.o. for  possible catheter angiogram in the morning,  and he will likely need a vascular surgery evaluation in the morning.      Michael L. Thad Ranger, M.D.  Electronically Signed     MLR/MEDQ  D:  10/02/2008  T:  10/03/2008  Job:  [id number]

## 2010-09-17 NOTE — Discharge Summary (Signed)
NAME:  Edward [name], Edward [name]                  ACCOUNT NO.:  [id number]   MEDICAL RECORD NO.:  [id number]          PATIENT TYPE:  INP   LOCATION:  3312                         FACILITY:  [facility]   PHYSICIAN:  [name], M.D.DATE OF BIRTH:  [birth year]   DATE OF ADMISSION:  10/02/2008  DATE OF DISCHARGE:  10/06/2008                               DISCHARGE SUMMARY   FINAL DISCHARGE DIAGNOSES:  1. Symptomatic recurrent right carotid stenosis resulting in stroke.  2. Chronic sinusitis.  3. Hypertension.  4. Dyslipidemia.   PROCEDURE PERFORMED:  Right carotid endarterectomy with replacement of  right carotid artery with interposition greater saphenous vein from the  left thigh.  This was done by Dr. Edilia Bo on October 05, 2008.   COMPLICATIONS:  None.   CONDITION ON DISCHARGE:  Stable, improving.   DISCHARGE MEDICATIONS:  He is instructed to resume all previous  medications consisting of:  1. Metoprolol ER 25 mg p.o. daily.  2. Aspirin 325 mg p.o. daily.  3. Enalapril 20 mg p.o. daily.  4. Lexapro 20 mg p.o. daily.   He is given prescriptions for:  1. Tylox 1 p.o. q.4 hours p.r.n. pain, total #20 were given.  2. Augmentin 875 mg p.o. twice daily for 2 weeks.  3. Flagyl 500 mg p.o. t.i.d. for 10 days.   DISPOSITION:  He is being discharged home in stable condition with his  wounds healing well.  He is given careful instructions regarding the  care of his wounds and activity level.  He is given an appointment to  see Dr. Edilia Bo in 2 weeks for followup, the office will arrange a  visit.  He is to call Dr. Allene Pyo office to schedule an appointment  within the next 2 weeks for continuing followup.   BRIEF IDENTIFYING STATEMENT OF COMPLETE DETAILS:  Please refer the typed  history and physical.  Briefly, this very pleasant 60 year old gentleman  was admitted to the hospital with expressive aphasia.  A stat head CT  scan demonstrated an infarct.  This was felt to be an embolic  phenomenon.  It further demonstrated left maxillary sinus occlusion  which was consistent with sinus syndrome.   HOSPITAL COURSE:  He was admitted to a bed in a neurological intensive  care unit.  MRI confirmed presence of a stroke.  His aphasia was  improving.  He underwent a cerebral angiogram on October 03, 2008, which  demonstrated occlusion of his left common carotid with an approximate 49-  60% narrowing of his right internal carotid artery.  His right external  carotid artery was occluded.   An ENT consult was obtained and septoplasty with endoscopic left  maxillary osteotomy to relieve the maxillary sinus obstruction was  recommended to be performed, pending clearance for surgery.   With these symptomatic results and studies from the MRI, Dr. Edilia Bo  evaluated him.  He found him to be a suitable candidate for redo right  carotid endarterectomy for stroke prevention.  He was informed of the  risks and benefits of the procedure and after careful consideration he  elected to proceed with  surgery.   Preoperative workup was completed.  He was taken to the operating room  on October 05, 2008, and underwent the aforementioned right carotid  procedure.  For complete details, please refer the typed operative  report.  The procedure was without complications.  The following  morning, he was evaluated and found to be nonfocal.  He was walking and  improving with physical therapy.  His tongue was midline.  He was  desirous of discharge and was subsequently discharged to home on  Augmentin 875 mg p.o. b.i.d. and Flagyl 500 mg p.o. t.i.d.  He will  obtain the following appointments as previously mentioned.      [name], PA      [name], M.D.  Electronically Signed    KEL/MEDQ  D:  10/06/2008  T:  10/06/2008  Job:  [id number]

## 2010-09-17 NOTE — Assessment & Plan Note (Signed)
[facility]                          [facility] OFFICE NOTE   RIGGINS, CISEK                         MRN:          [id number]  DATE:05/10/2008                            DOB:          [birth year]    PRIMARY CARDIOLOGIST:  Learta Codding, MD, [facility]   REASON FOR VISIT:  Annual followup.   Edward [name] continues to report no recurrent angina pectoris, since  suffering an acute inferior myocardial infarction in January 2007.  He  has not had any subsequent ischemic testing.  When last seen here in the  clinic 1 year ago, he was cleared to proceed with lower back surgery,  with no additional testing required.  However, he informs me today that  his surgeons recommended to not proceed with surgery.   Unfortunately, Edward [name] continues to smoke, some 2-3 packs a day.  He  remains disinclined to stop doing so, at this point in time.   The patient did have surveillance carotid Dopplers done here at  [facility], following his last visit, with results notable for 50-69%  right ICA stenosis.  He also has bilateral, proximal ECA occlusions with  distal collateralization.  The patient seems to think that he did have  some subsequent studies done, per Dr. Nobie Putnam.  These records are  currently unavailable, however.   EKG in our office today indicates NSR at 67 bpm with LAD and non-  specific changes.   CURRENT MEDICATIONS:  1. Plavix.  2. Full dose aspirin.  3. Lipitor 80 daily.  4. Enalapril 20 daily.  5. Lexapro.   PHYSICAL EXAMINATION:  VITAL SIGNS:  Blood pressure 122/69, pulse 69 and  regular, and weight 177.  GENERAL:  A 60 year old male, sitting upright, no distress.  HEENT:  Normocephalic and atraumatic.  NECK:  Palpable bilateral carotid pulses with bilateral bruits.  LUNGS:  Diminished breath sounds, but without crackles or wheezes.  HEART:  Regular rate and rhythm.  No significant murmurs.  No rubs.  ABDOMEN:  Soft and nontender.  EXTREMITIES:  Palpable pulses with no significant edema.  NEUROLOGIC:  No focal deficit.   IMPRESSION:  1. Severe single-vessel coronary artery disease.      a.     Status post aborted inferior myocardial infarction, followed       by bare-metal stenting of subtotal proximal RCA, January 2007.      b.     Residual nonobstructive coronary artery disease.      c.     EF 50-55%; inferobasal akinesis.  2. Cerebrovascular disease.      a.     Status post prior bilateral carotid endarterectomy, with       residual bilateral bruits.      b.     50-69% right ICA stenosis, February 2009.      c.     Bilateral, proximal ECA occlusion with distal       collateralization.  3. Ongoing tobacco.  4. Dyslipidemia.  5. Hypertension.   PLAN:  1. Adjust medications as follows:  Complete current supply of Plavix,  then stop altogether.  The patient is now 2 years out since being      treated for his myocardial infarction with a bare-metal stent.      Moreover, we will also down titrate aspirin to 81 mg daily.  With      respect to a beta-blocker, there is no documentation as to why he      is no longer on metoprolol ER 25 mg daily, which was listed when he      was seen here in the clinic in 2008.  Barring any contraindication,      therefore, we will resume a beta-blocker, given his history of      myocardial infarction.  2. Surveillance carotid Dopplers, unless these have been done within      the last 6 months.  If these suggest persistent, significant      carotid artery disease, then recommendation would be to refer him      to a vascular surgeon, for possible redo carotid endarterectomy.  3. Surveillance fasting lipid/liver profile.  4. Return clinic followup with myself and Dr. Andee Lineman in 6 months.  Of      note, we spoke about the importance of smoking cessation.  However,      the patient remains disinclined to stop smoking, at this point in      time.      Gene Serpe, PA-C   Electronically Signed      Learta Codding, MD,FACC  Electronically Signed   GS/MedQ  DD: 05/10/2008  DT: 05/11/2008  Job #: [id number]   cc:   [name], M.D.

## 2010-09-17 NOTE — Consult Note (Signed)
NAME:  Edward [name]                  ACCOUNT NO.:  [id number]   MEDICAL RECORD NO.:  [id number]          PATIENT TYPE:  INP   LOCATION:  3108                         FACILITY:  [facility]   PHYSICIAN:  Kristine Garbe. Ezzard Standing, M.D.DATE OF BIRTH:  [birth year]   DATE OF CONSULTATION:  10/03/2008  DATE OF DISCHARGE:                                 CONSULTATION   REASON FOR CONSULTATION:  Evaluate the patient with chronic left  maxillary sinus disease.   BRIEF HISTORY:  Edward [name] is a 60 year old gentleman who has been  having chronic left facial, left retroorbital pain for the last couple  of months.  Recently, he developed some expressive aphasia and is  admitted for evaluation of left facial pain and expressive aphasia.  He  had a CT scan which showed chronic obstruction of the left maxillary  sinus with a hypoplastic-appearing or shrunken left maxillary sinus  cavity consistent with a silent sinus syndrome.  He has had complete  obstruction of left maxillary sinus with very small sinus cavity.  He  has normal aeration of the remaining sinuses.  He does have a  significant septal deviation to the left.  He has a history of allergies  as well as nasal fracture with chronic left-sided nasal obstruction.  Concerning his expressive aphasia, he apparently has had some stroke or  emboli, possibly related to pain medicine and narrowing of the carotid  vessels.  On exam at bedside, the patient describes pain mostly in the  left side of his face.  He has slight underdevelopment of left cheek  compared to the right side.  He has left-sided nasal obstruction.   IMPRESSION:  Chronic left maxillary sinus obstruction with (shrunken)  sinus.  Significant septal deviation to the left.   RECOMMENDATIONS:  The patient needs a septoplasty and endoscopic left  maxillary ostial enlargement to relieve the maxillary sinus obstruction  and relieve pain discomfort associated with this.  This can be performed  anytime in the next week or two when the patient is cleared for surgical  intervention.  We will plan on discussing plans with medical team.           ______________________________  Kristine Garbe. Ezzard Standing, M.D.     CEN/MEDQ  D:  10/03/2008  T:  10/03/2008  Job:  [id number]

## 2010-09-17 NOTE — Op Note (Signed)
NAME:  Edward [name], Edward [name]                  ACCOUNT NO.:  [id number]   MEDICAL RECORD NO.:  [id number]          PATIENT TYPE:  INP   LOCATION:  3312                         FACILITY:  [facility]   PHYSICIAN:  [name], M.D.DATE OF BIRTH:  03-19-1951   DATE OF PROCEDURE:  DATE OF DISCHARGE:                               OPERATIVE REPORT   PREOPERATIVE DIAGNOSIS:  Symptomatic recurrent right carotid stenosis.   POSTOPERATIVE DIAGNOSIS:  Symptomatic recurrent right carotid stenosis.   PROCEDURE:  Redo right carotid endarterectomy with replacement of  carotid artery with interposition greater saphenous vein from the left  leg.   SURGEON:  [name], MD   ASSISTANT:  [name], PA   ANESTHESIA:  General.   INDICATIONS:  This is a 60 year old gentleman who was admitted on Oct 02, 2008 with left eye pain and cloudy vision.  During his admission, he  developed an acute expressive aphasia.  He underwent an extensive workup  including a carotid duplex scan which showed evidence of an 80%  recurrent right carotid stenosis.  The left carotid was occluded.  He  had previous staged bilateral carotid endarterectomies in 2005.  Cerebral arteriogram showed a recurrent 60% by NASCET criteria recurrent  stenosis just above the patch in the right internal carotid artery.  He  had had bilateral acute and subacute frontoparietal strokes by MRI and a  redo right carotid endarterectomy was recommended in order to lower his  risk of future stroke.  Of note, he also need electively for an  operation for chronic left maxillary sinus obstruction and given the  risk of infection, I elected to use either vein patch or interposition  vein for his redo surgery.  The procedure and potential complications  were discussed with the patient preoperatively.  All of his questions  were answered and he was agreeable to proceed.   TECHNIQUE:  The patient was taken to the operating room and  received a  general anesthetic.  Arterial line had been placed by Anesthesia.  A  segment of saphenous vein was harvested from the left thigh using two  incisions.  Branches divided between clips and 3-0 silk ties.  This was  then left in situ.  Incision was made along the anterior border of the  sternocleidomastoid on the right.  The dissection carried down to the  previous carotid endarterectomy site which had adherent scar tissue  adjacent to the patch.  The common carotid artery was dissected free  proximal to the patch and then the internal carotid artery was  controlled above the patch.  This was fairly high dissection.  The  external carotid artery was occluded and this was not fully dissected  out.  The patient was then heparinized.  Clamps were then placed on the  internal and the common carotid artery and a longitudinal arteriotomy  was made through the patch and then  this was extended up through the  plaque proximally and distally.  A 10-French straining tube was placed  into the internal carotid artery back bled and then placed into the  common carotid artery and secured with Rumel tourniquet.  There was good  flow through the shunt.  Of note, there was good backbleeding also.  There was some thick intimal plaque present which could be  endarterectomized but up where the stenosis had been identified by the  angiogram it was a very ulcerated plaque that would not endarterectomize  nicely and it would have been impossible to leave a smooth surface  posteriorly.  For this reason I elected to replace the carotid artery  with the interposition vein graft.  In order to have a better size  match, I used the vein in a non-reverse fashion with the valves being  lysed with a valvulotome.  The vein was spatulated proximally.  The  shunt was then clamped and then temporarily removed and the vein placed  over the shunt and then the shunt replaced.  The proximal anastomosis  could fairly  easily be done with the shunt in place and this was done  with a 6-0 Prolene suture.  In trying to do the anastomosis distally it  was going to be impossible to really visualize adequately and as there  was excellent backbleeding I elected to do without the shunt.  The shunt  was removed.  The internal and common carotid arteries were clamped and  the vein was then cut to the appropriate length and spatulated.  The  ICA was cut up to normal-appearing artery and then spatulated and the  vein was sewn end-to-side to the artery using continuous 6-0 Prolene  suture.  Prior to completing the anastomosis the artery was back-bled  and flushed appropriately and the anastomosis completed.  Flow was  reestablished.  There was an excellent pulse distal to the anastomosis  and also good Doppler signal with good diastolic flow.  Hemostasis was  obtained in the wounds.  The neck incision was closed with a deep layer  of 3-0 Vicryl and the platysma was closed with running 3-0 Vicryl.  The  skin was closed with a 4-0 subcuticular stitch.  The groin incisions  were closed with a deep layer of 3-0 Vicryl and the skin closed with 4-0  Vicryl.  Sterile dressing was applied.  The patient tolerated the  procedure well and awoke neurologically intact.  All needle and sponge  counts were correct.      [name], M.D.  Electronically Signed     CSD/MEDQ  D:  10/05/2008  T:  10/06/2008  Job:  [id number]   cc:   Pramod P. Pearlean Brownie, MD  Kristine Garbe Ezzard Standing, M.D.  Acey Lav, MD

## 2010-09-20 NOTE — Discharge Summary (Signed)
NAME:  Edward Brown, Edward Brown                            ACCOUNT NO.:  [id number]   MEDICAL RECORD NO.:  [id number]                   PATIENT TYPE:  INP   LOCATION:  3308                                 FACILITY:  [facility]   PHYSICIAN:  [name], M.D.        DATE OF BIRTH:  03-Aug-1950   DATE OF ADMISSION:  10/31/2003  DATE OF DISCHARGE:  11/01/2003                                 DISCHARGE SUMMARY   ADMISSION DIAGNOSES:  Asymptomatic left carotid stenosis.   PAST MEDICAL HISTORY:  1. Bilateral carotid stenoses status post right carotid endarterectomy May     2005, by Dr. Edilia Bo.  2. Coronary artery disease with history of small myocardial infarction many     years ago.   ALLERGIES:  No known drug allergies.   DISCHARGE DIAGNOSES:  Left carotid stenosis status post left carotid  endarterectomy.   BRIEF HISTORY:  [name] is a 60 year old Caucasian man.  He was found to  have bilateral carotid bruits and was referred to Dr. Waverly Ferrari.  Dr. Edilia Bo recommended proceeding with staged bilateral carotid  endarterectomy, and he underwent a right carotid endarterectomy with Dacron  patch angioplasty Sep 28, 2003.  He has recovered well from this surgery and  was followed up back in the office on October 11, 2003.  Dr. Edilia Bo then  recommended proceeding with his left carotid endarterectomy.  Procedure,  risks and benefits were all discussed again, and Mr. Mole agreed to proceed  with surgery.   HOSPITAL COURSE:  On October 31, 2003, [name] was electively admitted to  [facility] under the care of Dr. Waverly Ferrari.  He underwent  the following surgical procedure:  Left carotid endarterectomy with Dacron  patch angioplasty.  He tolerated the procedure well, transferring in stable  condition to the PACU.  He was extubated immediately following surgery,  awoke from anesthesia neurologically intact.  He remained hemodynamically  stable immediately postop.  During his  hospitalization, he was seen by  smoking cessation counselor.  The patient stated he wanted to quit cold  [place].  He was given Transport planner and options for quitting were  reviewed.  He was followed up in the outpatient setting via telephone calls.   June 29, postop day #1, on morning rounds, [name] reported feeling very  comfortable.  His vital signs were stable.  He was afebrile.  He remains  neurologically intact.  A left neck incision was clean and dry.  Dr. Edilia Bo  felt he would be ready for discharge home this morning if he was able to  ambulate and tolerate his diet.  He was able to do both of these and he was  discharged home this morning November 01, 2003.   CONDITION ON DISCHARGE:  Improved.   DISCHARGE MEDICATIONS:  1. Lexapro 10 mg daily.  2. Xanax 0.5 mg t.i.d.  3. Lipitor 10 mg daily.  4. Aspirin 325 mg  daily.   PAIN MANAGEMENT:  Tylox 1-2 p.o. q.4h. p.r.n.   DISCHARGE INSTRUCTIONS:  Activities:  He has been asked to refrain from any  driving or heavy lifting.  Diet:  Continue to be a low-fat, low-salt, heart  healthy diet.   WOUND CARE:  He is to clean his incision daily.  He may start showering  tomorrow, November 02, 2003.  He has been reminded to refrain from smoking  cigarettes.   FOLLOWUP:  Dr. Edilia Bo would like to see him back at [facility] office on  Wednesday, July 13 at 9 a.m.      Toribio Harbour, N.P.                  [name], M.D.    CTK/MEDQ  D:  11/01/2003  T:  11/01/2003  Job:  [id number]   cc:   [name], M.D.  [address], [address]  [place]  [place] [address]  Fax: [phone]

## 2010-09-20 NOTE — Cardiovascular Report (Signed)
Edward Brown, BICKFORD                  ACCOUNT NO.:  [id number]   MEDICAL RECORD NO.:  [id number]           PATIENT TYPE:   LOCATION:                               FACILITY:  [facility]   PHYSICIAN:  Rollene Rotunda, M.D.   DATE OF BIRTH:  1950/08/25   DATE OF PROCEDURE:  08/30/2003  DATE OF DISCHARGE:                              CARDIAC CATHETERIZATION   PROCEDURE:  Left heart catheterization/coronary arteriography.   INDICATION:  Evaluate patient with chest pain (786.51).   PROCEDURAL NOTE:  Left heart catheterization was performed via the right  femoral artery.  The artery was cannulated using an anterior wall puncture.  A #6-French arterial sheath was inserted via the modified Seldinger  technique.  Preformed Judkins and a pigtail catheter were utilized.  The  patient tolerated the procedure well and left the lab in stable condition.   RESULTS:   HEMODYNAMICS:  LV 154/20, Ao 154/73.   CORONARIES:  The left main was normal.  The LAD was normal.  There was a  first diagonal which was moderate-sized and normal.  Second and third  diagonals were small-sized and normal.  The circumflex had an ostial 25%  stenosis.  In the A-V groove, there were diffuse 25% lesions.  The first  obtuse marginal was large and normal.  The second obtuse marginal had a  proximal 25% stenosis.  The right coronary artery was a large dominant  vessel with a long proximal 30% stenosis.   LEFT VENTRICULOGRAM:  The left ventriculogram was obtained in the RAO  projection.  The EF was 65% and normal.   CONCLUSION:  Non-obstructive coronary disease.   PLAN:  The patient will have a [facility] consult for consideration of left  carotid endarterectomy as an outpatient.        ___________________________________________  Rollene Rotunda, M.D.    JH/MEDQ  D:  04/18/2004  T:  04/18/2004  Job:  [id number]

## 2010-09-20 NOTE — Discharge Summary (Signed)
NAME:  Edward Brown, Edward Brown                            ACCOUNT NO.:  [id number]   MEDICAL RECORD NO.:  [id number]                   PATIENT TYPE:  INP   LOCATION:  3305                                 FACILITY:  [facility]   PHYSICIAN:  [name], M.D.        DATE OF BIRTH:  04/06/1951   DATE OF ADMISSION:  09/28/2003  DATE OF DISCHARGE:  09/29/2003                                 DISCHARGE SUMMARY   ADMISSION DIAGNOSIS:  Bilateral carotid stenoses.   ADDITIONAL DISCHARGE DIAGNOSES:  1. Bilateral carotid stenoses status post right carotid endarterectomy with     Dacron patch angioplasty.  2. History of chest pain which has been worked up in the past, and the     patient has nonobstructive coronary artery disease with ejection fraction     of 65%.   HOSPITAL MANAGEMENT AND PROCEDURE:  Right carotid endarterectomy with Dacron  patch angioplasty completed by Dr. Edilia Bo on Sep 28, 2003.   CONSULTATIONS:  None.   HISTORY OF PRESENT ILLNESS:  Edward Brown is a pleasant 60 year old gentleman  who was found to have bilateral carotid bruits by his primary medical  doctor, Dr. Nobie Putnam.  This prompted a carotid duplex scan which showed  bilateral carotid stenosis. As part of a preoperative evaluation for this,  the patient underwent a Cardiolite stress test which showed some inferior  thinning and an ejection fraction of 54%.  The patient subsequently  underwent a heart catheterization which was performed by Dr. Antoine Poche and  showed nonobstructive coronary artery disease with an ejection fraction of  65%.  The patient was then referred to [facility] for evaluation for staged  bilateral carotid endarterectomy.  The patient was seen and examined in  consultation by Dr. Edilia Bo of [facility] on Sep 20, 2003.  At that time, Dr.  Adele Dan impression was that the patient indeed had severe bilateral  carotid stenoses with a carotid duplex scan that showed greater than 80%  right common carotid artery stenosis  as well as a greater than 80% left  carotid artery stenosis.  Given bilateral severe carotid artery stenoses,  Dr. Edilia Bo recommended staged bilateral carotid endarterectomy.  Plan was  made to proceed with a right carotid endarterectomy first and then probably  plan a left carotid endarterectomy in three to four weeks after discharge.  The risks, benefits, and alternatives were discussed with the patient and  his family at that time.  They were in understanding and agreed to proceed  with surgery.   HOSPITAL COURSE:  Edward Brown was then electively admitted to [facility] on Sep 28, 2003.  The patient was taken to the operating room and  underwent right carotid endarterectomy with Dacron patch angioplasty  completed by Dr. Edilia Bo.  Overall, the patient tolerated this procedure  well and was extubated on the operating room table.  The patient was then  transferred to the postanesthesia care unit in stable conditions.  Once  awake, alert, and appropriate, the patient was then transferred to 3200 for  further management.   Postoperatively, the patient recovered quite well.  He recovered from  anesthesia without any neurologic deficits.  He remained afebrile and  hemodynamically intact throughout the evening of his surgery as well as  postop day #1.  The patient was saturating in the mid to high 90s on room  air.  He resumed normal bowel and bladder function.  He was tolerating a  regular diet.  He was ambulating in the hallways independently without  difficulty.  The patient's right neck incision showed no evidence of  hematoma, erythema, drainage.  Overall, the patient was deemed appropriate  for discharge then on postop day #1 or Sep 29, 2003.   DISPOSITION:  Edward Brown is being discharged to home in improved and stable  condition.   DISCHARGE MEDICATIONS:  1. Lexapro 10 mg daily.  2. Xanax 0.5 mg 3 times daily.  3. Lipitor 10 mg daily.  4. Aspirin 325 mg daily.  5. Percocet  7.5/500 mg 1 or 2 tablets every 4 to 6 hours as needed for pain.   DISCHARGE INSTRUCTIONS:  1. The patient is to avoid driving.  He is to avoid heavy lifting or     strenuous activity. He is to continue to walk daily.  2. Diet:  The patient has no restrictions, although we suggest he follow a     heart healthy, well balanced diet.  3. Wound Care:  The patient may shower starting Sep 30, 2003.  He is to wash     his incisions daily with soap and water.  He is to notify [facility] if he has     any redness, swelling, or drainage from his incision.  He is to also     notify us if he has a temperature greater than 101.0.   FOLLOW UP:  The patient is to see Dr.  Edilia Bo on Wednesday, June 8, at 3:30  p.m.      Carolyn A. Eustaquio Boyden.                  [name], M.D.    CAF/MEDQ  D:  11/10/2003  T:  11/12/2003  Job:  [id number]   cc:   [name], M.D.  [address]  [place]  [place] [address]  Fax: [phone]   Vida Roller, M.D.  Fax: [phone]

## 2010-09-20 NOTE — Assessment & Plan Note (Signed)
[facility]                          [facility] NOTE   JAYCEE, PELZER                         MRN:          [id number]  DATE:07/22/2006                            DOB:          [birth year]    Cardiologist:  Dr. Lewayne Bunting.  Primary care physician:  He sees [facility] in [place].   HISTORY OF PRESENT ILLNESS:  [name] is a very pleasant 60 year old  male patient with a history of coronary artery disease, status post  inferior wall myocardial infarction in January 2007.  At that time, he  had single-vessel disease with 99% thrombotic proximal RCA lesion.  This  was treated by Dr. Juanda Chance with bare-metal stent to the RCA.  His  residual stenosis included 40% mid LAD stenosis, circumflex with 20%  ostial stenosis, 25% mid and distal stenoses, 20% stenosis in the 3rd  obtuse marginal, RV branch with 50% focal stenosis, and 40% stenosis in  the distal RCA.  His EF at that time was 50% to 55% with inferior basal  akinesis.  He returns today for annual followup.  From a cardiovascular  standpoint, he has actually done very well.  Denies any chest pain or  shortness of breath.  Denies any syncope or near syncope.  Denies any  orthopnea or paroxysmal nocturnal dyspnea.  He does note lower extremity  cramping.  He does note a history of degenerative disk disease in his  back.  He notes that his problem is inoperable.  He says that his  cramping is worse at night.  He has been taking some over-the-counter  potassium pills, and this seems to help.  He is not on any diuretics.  He denies any unusual electrolyte losses.  He denies any symptoms  consistent with claudication.  He denies any symptoms of facial  drooping, monocular blindness, or unilateral weakness.  He says he gets  q. 1 to 2 year carotid ultrasound followups through [facility].   CURRENT MEDICATIONS:  1. Metoprolol ER 25 mg daily.  2. Lipitor 80  mg daily.  3. Clopidogrel 75 mg daily.  4. Aspirin 325 mg daily.  5. Lexapro 10 mg daily.  6. Nitroglycerin p.r.n. chest pain.   SOCIAL HISTORY:  He continues to smoke cigarettes.   PHYSICAL EXAMINATION:  He is a well-nourished, well-developed male, in  no acute distress.  Blood pressure is 142/74 with a pulse of 62.  Weight 191.2 pounds.  Repeat blood pressure by me was 130/50 with manual cuff.  HEENT:  Unremarkable.  NECK:  Without JVD.  CARDIAC:  Normal S1 and S2.  Regular rate and rhythm with a 1-2/6  systolic ejection murmur heard best at the right sternal border.  LUNGS:  Clear to auscultation bilaterally without wheezing, rhonchi, or  rales.  ABDOMEN:  Soft and non-tender.  EXTREMITIES:  Without edema.  VASCULAR EXAM:  Femoral pulses 2+ bilaterally without bruits.  Popliteal  and dorsalis pedis, and posterior tibialis pulses are 2+ bilaterally.  SKIN:  Warm and dry.  No lesions noted.  Electrocardiogram reveals sinus rhythm  with a heart rate of 61.  Left  axis deviation.  Inferior T wave inversions.  No significant change  since previous tracings.   IMPRESSION:  1. Coronary artery disease, status post acute inferior wall myocardial      infarction January 2007 treated with a bare-metal stent to the      proximal right coronary artery.  History of PCI at [facility]      in the past.  2. Preserved left ventricular function with an ejection fraction of      55%.  3. Cerebral vascular disease, status post bilateral carotid      endarterectomies.      a.     Followup carotid ultrasounds arranged through [facility].  4. Bilateral lower extremity cramping.  5. Ongoing tobacco abuse.  6. Hypertension.  7. Hyperlipidemia.  8. Systolic murmur.   PLAN:  The patient presents to the office today for followup.  He does  note some cramping that seems to be better with potassium  supplementation over the counter.  He denies any true symptoms of   claudication, and he has good pulses on exam without bruits.  I  recommended checking a BMET to make sure his potassium level was okay.  I question whether or not his symptoms are related to his lumbar spine  disease vs. restless leg syndrome.  He is also due for followup on his  lipids and LFTs, and we will get him set up for that as well.  He does  have a systolic murmur on exam.  This is probably aortic sclerosis, but  we will go ahead and get an echocardiogram to further evaluate.  I have  urged him to try to quit smoking.  We will continue all of his other  medications the same at this point, as listed above, and have him follow  up in 6 months' time.  I will discuss with Dr. Andee Lineman further whether or  not to continue him on Plavix indefinitely.      Tereso Newcomer, PA-C  Electronically Signed      Learta Codding, MD,FACC  Electronically Signed   SW/MedQ  DD: 07/22/2006  DT: 07/22/2006  Job #: [id number]

## 2010-09-20 NOTE — Op Note (Signed)
NAME:  Edward Brown, Edward Brown                            ACCOUNT NO.:  [id number]   MEDICAL RECORD NO.:  [id number]                   PATIENT TYPE:  INP   LOCATION:  3305                                 FACILITY:  [facility]   PHYSICIAN:  [name], M.D.        DATE OF BIRTH:  [birth year]   DATE OF PROCEDURE:  09/28/2003  DATE OF DISCHARGE:  09/29/2003                                 OPERATIVE REPORT   PREOPERATIVE DIAGNOSIS:  Bilateral carotid stenoses.   POSTOPERATIVE DIAGNOSIS:  Bilateral carotid stenoses.   OPERATION PERFORMED:  Right carotid endarterectomy with Dacron patch  angioplasty.   SURGEON:  [name], M.D.   ASSISTANT:  Eber Jones A. Eustaquio Boyden.   ANESTHESIA:  General.   INDICATIONS FOR PROCEDURE:  The patient is a 60 year old gentleman who was  found to have bilateral carotid bruits.  This prompted a duplex scan which  showed bilateral high grade carotid stenoses.  He underwent preoperative  cardiac clearance and was brought in for elective staged bilateral carotid  endarterectomies.  We elected to do the right side first.   DESCRIPTION OF PROCEDURE:  The patient was taken to the operating room after  an arterial line was placed by anesthesia.  The patient received a general  anesthetic.  The right neck was prepped and draped in the usual sterile  fashion.  An incision was made along the anterior border of the  sternocleidomastoid and the dissection carried down to the common carotid  artery which was dissected free and controlled with Rumel tourniquet.  Of  note, the plaque in the common carotid artery was fairly low and I had to  extend the dissection fairly low in the common carotid artery.  I was able  to get below the plaque.  The plaque did extend up into the internal carotid  artery.  The facial vein was divided between 2-0 silk ties and then the  internal carotid artery above the plaque was controlled with a blue vessel  loop.  The external  carotid artery was controlled was controlled with a blue  vessel loop and the superior thyroid artery controlled with a 2-0 silk tie.  The patient was then heparinized.  Clamps were then placed on the internal,  then the common, then the external carotid artery.  A longitudinal  arteriotomy was made in the common carotid artery.  This extended through  the plaque in the common carotid artery into the proximal common carotid  artery.  It was then extended up into the internal carotid artery above the  plaque.  A 12 shunt was placed into the internal carotid artery, back-bled  and then placed into the common carotid artery and secured with Rumel  tourniquet.  Flow was re-established with a shunt.  An endarterectomy plane  was established proximally and the plaque was sharply divided.  Eversion  endarterectomy was performed of the external carotid artery.  At this point  there was a nice taper in the plaque and no tacking sutures were required.  The vessel was irrigated with copious amounts of heparin and Dextran and all  loose debris removed.  A long Dacron patch was then sewn using continuous 6-  0 Prolene suture.  Prior to completing the closure, the shunt was removed.  The vessels were back-bled and flushed appropriately and the anastomosis  completed.  Flow was re-established first to the external carotid artery and  then to the internal carotid artery.  At the completion, there was an  excellent pulse distal to the patch and a good Doppler signal.  Hemostasis  was obtained in the wound and the heparin was partially reversed with  protamine.  The wound was  closed with a deep layer of 3-0 Vicryl.  The platysma was closed with  running 3-0 Vicryl.  The skin was closed with a 4-0 subcuticular stitch.  A  sterile dressing was applied.  The patient tolerated the procedure well and  awoke neurologically intact.  All needle and sponge counts were correct.                                                [name], M.D.    CSD/MEDQ  D:  09/28/2003  T:  09/29/2003  Job:  [id number]   cc:   [name], M.D.  [address], [address]  [place]  [place] [address]  Fax: [phone]

## 2010-09-20 NOTE — H&P (Signed)
NAME:  Edward Brown, Edward Brown                            ACCOUNT NO.:  [id number]   MEDICAL RECORD NO.:  [id number]                   PATIENT TYPE:  INP   LOCATION:                                       FACILITY:  [facility]   PHYSICIAN:  [name], M.D.        DATE OF BIRTH:  1951/01/13   DATE OF ADMISSION:  10/31/2003  DATE OF DISCHARGE:                                HISTORY & PHYSICAL   REASON FOR ADMISSION:  Left carotid stenosis.   HISTORY:  This is a pleasant, 61 year old gentleman who was found to have  bilateral carotid bruits.  This prompted a carotid duplex scan, which showed  high-grade bilateral carotid stenoses.  He underwent a preoperative cardiac  evaluation and ultimately had a heart catheterization by Dr. Antoine Poche, which  showed nonobstructive coronary artery disease.  Ejection fraction was 65%.  He was referred to our office for a carotid evaluation and given the  severity of the stenoses I recommended staged bilateral carotid  endarterectomies.  He underwent right carotid endarterectomy with Dacron  patch angioplasty on Sep 28, 2003.  He did well postoperatively and was  discharged on postoperative day number one.  He was seen back in the office  in followup on October 11, 2003, and was doing well, although he had just turned  his ankle and was to be seen by an orthopedic doctor.  He wanted to wait a  few more weeks before scheduling his carotid endarterectomy and this was  scheduled for October 31, 2003.  Of note, the patient denies any history of  stroke, TIAs, expressive or receptive aphasia or amaurosis fugax.   PAST MEDICAL HISTORY:  Unremarkable except for his smoking.  He denies any  history of diabetes, hypertension, hypercholesterolemia, history of previous  myocardial infarction or history of congestive heart failure.   FAMILY HISTORY:  His father died from a heart attack at age 12.  He is  unaware of any other history of premature cardiovascular disease.   SOCIAL HISTORY:  He is married and has four children.  He works in  [employer].  He smokes a pack per day of cigarettes and has been smoking  for 30 years.   MEDICATIONS:  1. Lexapro 10 mg p.o. q. day.  2. Xanax 0.5 mg p.o. t.i.d.  3. Lipitor 10 mg p.o. q.h.s.  4. Aspirin 325 mg p.o. q. day.   ALLERGIES:  The patient has no known drug allergies.   REVIEW OF SYSTEMS:  The patient has had no recent weight loss, weight gain  or problems with his appetite.  He has had no fever or chills.  CARDIAC:  He  has had no recent chest pain, chest pressure, palpitations, arrhythmias or  dyspnea on exertion.  PULMONARY:  He has had no bronchitis, asthma or  wheezing.  He is a heavy smoker.  GI:  He has had no recent change in his  bowel  habits and has no history of peptic ulcer disease.  GU:  He has had no  dysuria or frequency.  VASCULAR:  He denies any claudication, rest pain or  nonhealing ulcers.  He has had no previous history of DVT or phlebitis.  NEUROLOGIC:  He has had no blackouts, headaches or seizures.  ORTHOPEDIC/SKIN:  He has recently twisted his ankle and is scheduled to be  seen by the orthopedic physician.  PSYCHIATRIC:  He has had no history of  depression or nervousness.  HEMATOLOGIC:  He has had no bleeding problems or  clotting disorders.   PHYSICAL EXAMINATION:  VITAL SIGNS:  Blood pressure 142/72; heart rate 72.  NECK:  He has a left carotid bruit.  LUNGS:  Clear bilaterally to auscultation.  CARDIAC:  He has a regular rate and rhythm.  ABDOMEN:  Soft and nontender.  I cannot palpate an aneurysm.  EXTREMITIES:  He has palpable femoral and popliteal pulses bilaterally.  Feet are warm and well-perfused.  He has no significant lower extremity  swelling.  NEUROLOGIC:  Nonfocal.   Carotid duplex scan shows a greater than 80% left carotid stenosis.  He has  undergone recent right carotid endarterectomy.   He presents now for a staged left carotid endarterectomy after his  recent  right carotid endarterectomy.  The procedure and potential complications,  including but not limited to bleeding, stroke, perioperative risk (1-2%),  nerve injury, MI or other unpredictable medical problems, were discussed  with the patient.  All of his questions were answered and he is agreeable to  proceed.  He does know to continue taking his aspirin right up through  surgery.                                                [name], M.D.    CSD/MEDQ  D:  10/11/2003  T:  10/12/2003  Job:  [id number]

## 2010-09-20 NOTE — Discharge Summary (Signed)
NAME:  Edward Brown, Edward Brown                            ACCOUNT NO.:  [id number]   MEDICAL RECORD NO.:  [id number]                   PATIENT TYPE:  INP   LOCATION:  2001                                 FACILITY:  [facility]   PHYSICIAN:  Vida Roller, M.D.                DATE OF BIRTH:  1951-01-16   DATE OF ADMISSION:  08/28/2003  DATE OF DISCHARGE:  08/31/2003                                 DISCHARGE SUMMARY   PRIMARY DIAGNOSES:  1. Carotid artery stenosis.  2. Non-obstructive coronary disease.   HISTORY OF PRESENT ILLNESS:  This is a 60 year old gentleman who first came  to [place] a week or so ago in the context of an abnormal carotid Doppler  for which he was then subsequently referred for Cardiolite scanning, results  of which were not available.  Past cardiac history notable for a small MI 20  years ago, at which time he underwent a catheterization and a Roto-rooter.  Since that time, he has had problems with intermittent and infrequent  recurrent midsternal chest discomfort.  He has had episodes a couple of  weeks ago that took him to see his primary care physician who referred him  for cardiac evaluation.  On the day of admission, he had recurrence of the  same discomfort while drinking coffee.  It persisted despite nitroglycerin,  became slightly severe.  It did not radiate.  It was associated with  shortness of breath.  He subsequently went to [facility] where  he was given nitroglycerin with resolution of his pain.  Enzymes demonstrate  abnormal CK at 262, with a MB of 23.5, and a troponin on presentation was  normal.  The patient had no recurrent chest discomfort.  He does have  dyspnea on exertion and does have symptoms of claudication and appropriate  dyslipidemia for which he takes Lipitor.  Does not have hypertension.  The  patient smokes more than 3 packs of cigarettes per day.   HOSPITAL COURSE:  The patient was admitted.  He had a carotid duplex which  was  performed on August 16, 2003, which showed multi-focal ___________and  right common carotid artery at the origin, and a right ICA, and a proximal  left ICA, and ECA.  Most critical narrowing appeared to be in the right  common carotid artery.  Left internal carotid artery.  Peak systolic  velocities are reported on sheet.  Impression was left-sided critical  internal carotid artery stenosis, 80 to 99%.  Surgical consult was  recommended.  High-grade common carotid artery stenosis on the right.   The patient was admitted, underwent a cardiac catheterization which showed  left main normal, LAD normal, D1 moderate to normal, D2 and D3 small normal,  circ ostial 25%, AV groove 50 to 25% lesion, OM1 large normal, OM2 proximal  25%, RCA large dominant, large proximal 30% with an EF of 65%.  The patient  was shown to have non-obstructive coronary artery disease.  Plan was for a  CVTS consult for carotid endarterectomy as an outpatient.  The patient was  discharged to home on a coated aspirin 325 mg daily, Lipitor 10 mg nightly,  Tylenol one to two tabs q.4-6h. as needed for pain.   ACTIVITY:  No heavy lifting or strenuous activity for four days.  No driving  for two days.   DIET:  Low fat, low salt, low cholesterol diet.   DISCHARGE INSTRUCTIONS:  1. He was to call if he developed a lump, drainage at his groin, or a     temperature above 101.  2. He was allowed to return to work on Monday, Sep 03, 2003.   FOLLOWUP:  1. He was to follow up with Dr. Edilia Bo in [place], Sep 20, 2003, at 3:30     p.m.  2. Dr. Dorethea Clan in [place] on Sep 14, 2003, at 2 p.m.      Chinita Pester, C.R.N.P. [facility]                 Vida Roller, M.D.    DS/MEDQ  D:  08/31/2003  T:  08/31/2003  Job:  [id number]   cc:   Vida Roller, M.D.  Fax: [phone]   [name], M.D.  [address], [address]  [place]  [place] [address]  Fax: [phone]   [name], M.D.  [address]  [place]   [place] [address]

## 2010-09-20 NOTE — H&P (Signed)
NAME:  Edward Brown, Edward Brown                            ACCOUNT NO.:  [id number]   MEDICAL RECORD NO.:  [id number]                   PATIENT TYPE:  INP   LOCATION:                                       FACILITY:  [facility]   PHYSICIAN:  [name], M.D.        DATE OF BIRTH:  Nov 10, 1950   DATE OF ADMISSION:  09/28/2003  DATE OF DISCHARGE:                                HISTORY & PHYSICAL   REASON FOR ADMISSION:  Bilateral carotid stenoses.   HISTORY:  This is a pleasant 60 year old gentleman who was found to have  bilateral carotid bruits by Dr. Nobie Putnam. This prompted a duplex scan, which  showed very tight bilateral carotid stenoses. He was sent over for further  evaluation.   Of note, the patient is left-handed. He denies any previous history of  stroke, TIAs, expressive or receptive aphagia, or amaurosis fugax.                                                [name], M.D.    CSD/MEDQ  D:  09/20/2003  T:  09/20/2003  Job:  [id number]

## 2010-09-20 NOTE — Cardiovascular Report (Signed)
NAME:  Edward Brown, Edward Brown                  ACCOUNT NO.:  [id number]   MEDICAL RECORD NO.:  [id number]          PATIENT TYPE:  OIB   LOCATION:  2910                         FACILITY:  [facility]   PHYSICIAN:  Charlies Constable, M.D. [facility] DATE OF BIRTH:  23-Apr-1951   DATE OF PROCEDURE:  05/29/2005  DATE OF DISCHARGE:                              CARDIAC CATHETERIZATION   PROCEDURE:  Percutaneous coronary intervention.   CLINICAL HISTORY:  [name] is 60 years old and presented yesterday with  chest pain and inferior ST elevation.  He was brought to the lab but was  very combative and his ST segments resolved so the procedure was aborted.  He ha a diagnostic procedure today by Dr. Diona Browner which showed a tight  lesion in the proximal right coronary artery with no major obstruction in  the left coronary system and he is brought here for intervention.   PROCEDURE:  The procedure was performed via the right femoral arteries and  arterial sheath and a 6 [place] JR-4 guiding catheter with side holes.  We  used a Research scientist (physical sciences) and crossed the lesion in the ostial and proximal right  coronary artery with the wire without difficulty.  We predilated 2 x 20 mm  Maverick performing one inflation up to 10 atmospheres for 30 seconds.  We  then deployed a 2.5 x 18 mm Minivision stent deploying this with one  inflation of 14 atmospheres for 30 seconds.  The proximal edge of the stent  ended just before the ostium.  We post dilated the stent with a 3 x 15 mm  Quantum Maverick performing two inflations up to 16 atmospheres for 30  seconds.  Final diagnostic study was then performed through the guiding  catheter.  After we completed the procedure, there appeared there was  increased ST elevation in the inferior leads. We went back and took another  picture and there was brisk TIMI flow with no obstruction and no evidence of  distal __________ embolization.   RESULTS:  Initially the stenosis in proximal right coronary  artery was 95%  with a large filling defect felt to represent a grade IV thrombus.  Following PTCA and stenting, this improved to less than 10% with resolution  of the thrombus.   CONCLUSION:  Successful percutaneous coronary intervention of the lesion in  the ostial and proximal right coronary artery using a Minivision bare metal  stent with improvement in percent of narrowing from 95% to less than 10%.   DISPOSITION:  Patient taken to the post anesthesia care unit for further  observation.           ______________________________  Charlies Constable, M.D. [facility]     BB/MEDQ  D:  05/29/2005  T:  05/29/2005  Job:  [id number]   cc:   [name], M.D.  Fax: [phone]   Vida Roller, M.D.  Fax: [phone]   Jonelle Sidle, M.D. [facility]  [address]  [place]  [place] [address]   Cardiopulmonary Lab

## 2010-09-20 NOTE — Cardiovascular Report (Signed)
NAME:  QAIS, JOWERS                  ACCOUNT NO.:  [id number]   MEDICAL RECORD NO.:  [id number]          PATIENT TYPE:  OIB   LOCATION:  2910                         FACILITY:  [facility]   PHYSICIAN:  Jonelle Sidle, M.D. LHCDATE OF BIRTH:  04-27-1951   DATE OF PROCEDURE:  05/29/2005  DATE OF DISCHARGE:                              CARDIAC CATHETERIZATION   INDICATIONS:  Mr. Ostergaard is a 60 year-old male with a history of tobacco and  alcohol use, hyperlipidemia, reported medical noncompliance, carotid artery  disease status post bilateral carotid endarterectomies, and previously  documented nonobstructive coronary atherosclerosis at catheterization in  2005. He recently presented to [facility] with evidence of  an acute coronary syndrome and was transferred for cardiac catheterization  to define the coronary anatomy and assess for revascularizations strategies.  Reviewing the chart, it is apparent that the patient's procedure was  cancelled yesterday given his inability to cooperate safely with the  procedure. It is reported that he was chest pain-free at that time.  He was  therefore medically managed and rescheduled for today. I discussed the  procedure with him and he voiced and understanding and willingness to  participate. The risks and benefits were explained to him and informed  consent was obtained prior to proceeding.   PROCEDURES PERFORMED:  1.  Left heart catheterization.  2.  Selective coronary angiography.  3.  Left ventriculography.   ACCESS AND EQUIPMENT:  The area about the right femoral artery was  anesthetized with 1% lidocaine and a 5-French sheath was placed in the right  femoral artery via modified Seldinger technique. Standard preformed 5-French  JL-4 and JR-4 catheters were used for selective coronary angiography and an  angled pigtail catheter was used for left heart catheterization and left  ventriculography. All exchanges were made over a  wire. The patient tolerated  procedure well without any complications. A total of 80 mL  Omnipaque were  used during the procedure.   HEMODYNAMIC RESULTS:  Aorta 103/60 mmHg. Left ventricle 103/21 mmHg   ANGIOGRAPHIC FINDINGS:  1.  The left main coronary artery has no significant flow-limiting      obstruction and gives rise to left anterior descending and circumflex      coronary arteries.  2.  The left anterior descending is a medium caliber vessel with large      septal perforator and large proximal diagonal branch as well as two      smaller diagonal branches distally. There are minor luminal      irregularities noted, in one-view 840% stenosis in the mid left anterior      descending.  3.  The circumflex coronary artery is a medium caliber vessel with four      obtuse marginal branches, the first of which is the largest. There is a      20% ostial stenosis in the circumflex with 25% stenoses in the mid and      distal segment and other minor luminal irregularities. There is      approximately 20% stenosis in the third obtuse marginal branch as  well.  4.  The right coronary artery is a medium caliber dominant vessel with a      medium sized right ventricular marginal branch, posterior descending      branch and posterolateral branch. Within the proximal right coronary      artery is a 99% thrombotic stenosis. There is TIMI-2 flow noted in the      vessel distally. The right ventricular marginal branch has a 50% focal      stenosis and there was a 40% stenosis in the distal right coronary      artery as well.   Left ventriculography was performed in the RAO projection and reveals an  ejection fraction approximately 50 to 55% with inferior basal akinesis.   DIAGNOSES:  1.  Severe single-vessel coronary disease with a 99% thrombotic proximal      right coronary artery stenosis and TIMI-2 flow in the distal vessel.      Other nonobstructive stenoses are noted as outlined above.  2.   Left ventricular ejection fraction of 50-55% with inferior basal      akinesis no significant mitral regurgitation and left ventricular end-      diastolic pressure of 21 mmHg.   DISCUSSION:  I discuss the results with the patient. Plan will be for Dr.  Juanda Chance to review the films in anticipation of percutaneous coronary  intervention to address the proximal right coronary stenosis today.           ______________________________  Jonelle Sidle, M.D. [facility]     SGM/MEDQ  D:  05/29/2005  T:  05/29/2005  Job:  [id number]   cc:   [name], M.D.  Fax: [phone]   Learta Codding, M.D. [facility]  [address]  [address]  [place]  [place] [address]

## 2010-09-20 NOTE — Op Note (Signed)
   NAMESTEFFEN, Edward Brown                              ACCOUNT NO.:  [id number]   MEDICAL RECORD NO.:  [id number]                   PATIENT TYPE:  AMB   LOCATION:  DSC                                  FACILITY:  [facility]   PHYSICIAN:  Lowell Bouton, M.D.      DATE OF BIRTH:  05-May-1951   DATE OF PROCEDURE:  01/05/2002  DATE OF DISCHARGE:                                 OPERATIVE REPORT   PREOPERATIVE DIAGNOSIS:  Right carpal tunnel syndrome.   POSTOPERATIVE DIAGNOSIS:  Right carpal tunnel syndrome.   PROCEDURE:  Decompression of the median nerve, right carpal tunnel.   SURGEON:  Lowell Bouton, M.D.   ANESTHESIA:  0.5% Marcaine local with sedation.   OPERATIVE FINDINGS:  The patient had no masses present in the carpal canal.  The motor branch was intact.   DESCRIPTION OF PROCEDURE:  Under 0.5% Marcaine local anesthesia with a  tourniquet on the right arm, the right hand was prepped and draped in the  usual fashion and after exsanguinating the limb, the tourniquet was inflated  to 225 mmHg.  A 3 cm longitudinal incision was made in the palm just ulnar  to the thenar crease.  Sharp dissection was carried through the subcutaneous  tissues, and bleeding points were coagulated.  Blunt dissection was carried  through the superficial palmar fascia distal to the transverse carpal  ligament.  A hemostat was then placed in the carpal canal up against the  hook of the hamate, and the transverse carpal ligament was divided on the  ulnar border of the median nerve.  The proximal end of the ligament was  divided with scissors after dissecting the nerve away from the undersurface  of the ligament.  The carpal canal was then examined and the motor branch of  the nerve was identified.  The external epineurium was released on the  median nerve.  The wound was then irrigated with saline.  The skin was  closed with 4-0 nylon suture.  Sterile dressings were applied, followed by a  volar  wrist splint.  The patient tolerated the procedure well and went to  the recovery room awake and stable, in good condition.                                               Lowell Bouton, M.D.    EMM/MEDQ  D:  01/05/2002  T:  01/06/2002  Job:  [id number]

## 2010-09-20 NOTE — H&P (Signed)
NAME:  Edward Brown, Edward Brown                            ACCOUNT NO.:  [id number]   MEDICAL RECORD NO.:  [id number]                   PATIENT TYPE:  INP   LOCATION:  2001                                 FACILITY:  [facility]   PHYSICIAN:  [name], M.D.        DATE OF BIRTH:  [birth year]   DATE OF ADMISSION:  09/28/2003  DATE OF DISCHARGE:  08/31/2003                                HISTORY & PHYSICAL   REASON FOR ADMISSION:  Bilateral carotid stenoses.   HISTORY OF PRESENT ILLNESS:  This is a pleasant 60 year old gentleman who  was found to have bilateral carotid bruits by his primary medical doctor,  Dr. Nobie Putnam.  This prompted a carotid Duplex scan which showed bilateral  carotid stenoses.  As part of a preoperative evaluation for this, he  underwent a Cardiolite which showed some inferior thinning.  The ejection  fraction was 54%.  He subsequently had heart catheterization performed by  Dr. Antoine Poche which showed non-obstructive coronary artery disease with an  ejection fraction of 65%.  He was referred to our office for evaluation for  staged bilateral carotid endarterectomies.   The patient denies any previous history of stroke, TIA's, expressive or  receptive aphasia or amaurosis fugax.  Of note, he is left handed.   PAST MEDICAL HISTORY:  1. Chest pain which has been worked up as described above.  He has no     significant coronary artery disease.  2. The patient denies any history of diabetes, hypertension,     hypercholesterolemia, history of previous myocardial infarction or     history of congestive heart failure.   FAMILY HISTORY:  He states that his father died of a heart attack at age 75.  He is unaware of any other history of premature cardiovascular disease.   SOCIAL HISTORY:  He is married and has four children.  He works in  [employer]. He smokes a pack per day of cigarettes and has been smoking  for 30 years.  He does not use alcohol on a regular basis.   MEDICATIONS:  1. Lexapro 10 mg p.o. daily.  2. Xanax 0.5 mg p.o. t.i.d.  3. Lipitor 10 mg p.o. q.h.s.  4. Aspirin 325 mg p.o. daily.   ALLERGIES:  The patient denies any drug allergies.   REVIEW OF SYMPTOMS:  GENERAL:  He has had some weight loss and loss of  appetite recently.  He has had no fever or chills.  He weighs 151 pounds.  CARDIAC:  He has had no recent chest pain, chest pressure, palpitations,  arrhythmias or significant dyspnea on exertion.  PULMONARY:  He has had no  bronchitis, asthma or wheezing.  GI:  He has had no recent change in his  bowel habits and has no history of peptic ulcer disease. GU:  He has had no  dysuria or frequency.  VASCULAR:  He denies any history of claudication,  rest pain or non-healing  ulcers.  He has had no history of deep venous  thrombosis or phlebitis.  NEUROLOGIC:  He has some occasional dizziness.  He  has had no blackouts.  He has also been having some headaches recently.  ORTHOPEDIC/SKIN:  He has had no arthritis, joint pain, muscle pain or rash.  PSYCHIATRIC:  He has had a history of depression and nervousness.  HEMATOLOGIC:  He has had no bleeding problems or clotting disorders that he  is aware of.   PHYSICAL EXAMINATION:  VITAL SIGNS:  Blood pressure is 122/58 on the right  and 132/50 on the left, heart rate is 72.  HEENT:  He has bilateral carotid bruits.  LUNGS:  Clear bilaterally to auscultation.  CARDIAC EXAM:  He has a regular rate and rhythm.  ABDOMEN:  Soft and non-tender.  I cannot palpate an aneurysm.  He has  palpable femoral and popliteal pulses bilaterally.  EXTREMITIES:  Both feet are warm and well perfused.  NEUROLOGIC EXAM:  Nonfocal.  He has no significant motor or sensory deficit.   LABORATORY DATA:  Carotid Duplex scan shows a greater than 80% right common  carotid artery stenosis just below the bifurcation.  On the left side he has  a greater than 80% bifurcation stenosis.  Both vertebral arteries are patent   with normally directed flow.  Arm pressures are equal.   ASSESSMENT AND PLAN:  Given bilateral severe carotid stenoses, I have  recommended staged bilateral carotid endarterectomies. We will proceed with  a right carotid endarterectomy on the day of admission and then probably  plan a left carotid endarterectomy in three to four weeks.  We have  discussed the indications for the procedure.  He understands the indication  is to lower his risk of future stroke.  We have also discussed the potential  complications of surgery including, but not limited to, bleeding, stroke  (peri-procedural risk of 1 to 2%), nerve injury, myocardial infarction or  other unpredictable medical problems. All of his questions are answered and  he is agreeable to proceed.                                                [name], M.D.    CSD/MEDQ  D:  09/20/2003  T:  09/20/2003  Job:  [id number]   cc:   [name], M.D.  [address]  [place]  [place] [address]  Fax: [phone]   Vida Roller, M.D.  Fax: [phone]

## 2010-09-20 NOTE — Consult Note (Signed)
Edward Brown, Edward Brown                  ACCOUNT NO.:  [id number]   MEDICAL RECORD NO.:  [id number]          PATIENT TYPE:  EMS   LOCATION:  ED                            FACILITY:  [facility]   PHYSICIAN:  Ky Barban, M.D.DATE OF BIRTH:  May 16, 1950   DATE OF CONSULTATION:  09/13/2005  DATE OF DISCHARGE:  09/13/2005                                   CONSULTATION   CHIEF COMPLAINT:  Difficulty to void.   HISTORY OF PRESENT ILLNESS:  This is a 60 year old gentleman who was  referred to me by Dr. Sherwood Gambler.  I saw him this morning, and the patient was  complaining of difficulty voiding, and he was sent to the emergency room  where I checked him.  The patient has been having, for the last week,  increasing difficulty to void.  No fever, chills or hematuria.   PAST MEDICAL HISTORY:  1.  Bilateral carotid endarterectomy done last year.  2.  Coronary artery stent placements.  3.  No history of diabetes.   SOCIAL HISTORY:  Unremarkable.   PHYSICAL EXAMINATION:  GENERAL APPEARANCE:  Well-developed, well-nourished,  pleasant gentleman.  VITAL SIGNS:  Blood pressure 130/80, temperature normal.  ABDOMEN:  Soft, flat.  Liver, spleen and kidneys not palpable.  EXTERNAL GENITALIA:  Uncircumcised.  Meatus is very tight, scarred,  inflamed.  Testicles are normal.  RECTAL:  Deferred.   IMPRESSION:  Meatal stenosis.   RECOMMENDATIONS:  Meatotomy under local anesthesia here.  Gave the  instructions to apply Neosporin.  Will see him in the office next week.  I  also gave him Percocet 1-2 q.6 h p.r.n. #30.  Appreciate Dr.  Sherwood Gambler for  allowing me to see this patient.   PROCEDURE NOTE:   PREOPERATIVE DIAGNOSIS:  Meatal stenosis.   POSTOPERATIVE DIAGNOSIS:  Meatal stenosis.   ANESTHESIA:  Xylocaine 1% about 3 cc.   PROCEDURES:  The patient is placed in spine position and prepped and draped  under sterile condition.  Local anesthesia is injected in the meatus, and  then _____meatotomy is done.  The  meatus is checked with _____sign to 36  [place].  Three stitches of 5-0 chromic are placed in the margins at the  meatus.  There is no bleeding.  Neosporin ointment applied.  The patient  left the procedure room in satisfactory condition.      Ky Barban, M.D.  Electronically Signed     MIJ/MEDQ  D:  09/13/2005  T:  09/15/2005  Job:  [id number]   cc:   Madelin Rear. Sherwood Gambler, MD  Fax: [phone]

## 2010-09-20 NOTE — Op Note (Signed)
NAME:  Edward Brown, Edward Brown                            ACCOUNT NO.:  [id number]   MEDICAL RECORD NO.:  [id number]                   PATIENT TYPE:  INP   LOCATION:  2899                                 FACILITY:  [facility]   PHYSICIAN:  [name], M.D.        DATE OF BIRTH:  [birth year]   DATE OF PROCEDURE:  10/31/2003  DATE OF DISCHARGE:                                 OPERATIVE REPORT   PREOPERATIVE DIAGNOSIS:  Asymptomatic greater than 80% left carotid  stenosis.   POSTOPERATIVE DIAGNOSIS:  Asymptomatic greater than 80% left carotid  stenosis.   PROCEDURE:  Left carotid endarterectomy with Dacron patch angioplasty.   SURGEON:  [name], M.D.   ASSISTANT:  Eber Jones A. Eustaquio Boyden.   ANESTHESIA:  General.   INDICATIONS:  This is a pleasant 60 year old gentleman who was found to have  bilateral carotid bruits.  This prompted a duplex scan, which showed  bilateral high-grade carotid stenoses.  He had undergone previous right  carotid endarterectomy and now is brought back for staged left carotid  endarterectomy.  The procedure and potential complications have been  discussed with the patient preoperatively.  All his questions had been  answered and he was agreeable to proceed.   TECHNIQUE:  The patient was taken to the operating room after an arterial  line was placed by anesthesia.  The neck and upper chest were prepped and  draped in the usual sterile fashion.  An incision was made along the  anterior border of the sternocleidomastoid on the left and the dissection  carried down to the common carotid artery, which was dissected free and  controlled with a Rumel tourniquet.  The plaque extended fairly low.  The  facial vein was divided between 2-0 silk ties.  The internal carotid artery  above the plaque was controlled with a blue vessel loop.  The superior  thyroid artery was controlled with a 2-0 silk tie.  The external carotid  artery was controlled with a  blue vessel loop.  The patient was then  heparinized.  Clamps were then placed on the internal, then the external,  then the common carotid artery.  A longitudinal arteriotomy was made in the  common carotid artery.  This was extended through the plaque into the  internal carotid artery.  A 12 Bard shunt was placed into the internal  carotid artery, backbled, and then placed into the common carotid artery and  secured with a Rumel tourniquet.  Flow was re-established through the shunt.  An endarterectomy plane was established proximally.  The plaque was sharply  divided.  Eversion endarterectomy was performed of the external carotid  artery.  Distally there was a nice taper in the plaque.  One tacking suture  was used.  The artery was then irrigated with copious amounts of Dextran and  heparin and all loose debris removed.  A Dacron patch was then sewn using a  continuous  6-0 Prolene suture.  Prior to completing the patch closure, the  shunt was removed.  The arteries were backbled and flushed appropriately and  the anastomosis completed.  Flow was re-established first to the external  carotid artery and then to the internal carotid artery.  At the completion  there was a good pulse distal to the patch and a good Doppler signal.  Hemostasis  was obtained in the wound, and then the heparin was partially reversed with  protamine.  The wound was closed with a deep layer of 3-0 Vicryl and the  skin closed with 4-0 Vicryl.  A sterile dressing was applied.  The patient  tolerated the procedure well and awoke neurologically intact.  All needle  and sponge counts were correct.                                               [name], M.D.    CSD/MEDQ  D:  10/31/2003  T:  10/31/2003  Job:  [id number]

## 2010-09-20 NOTE — Discharge Summary (Signed)
Edward Brown, Edward Brown                  ACCOUNT NO.:  [id number]   MEDICAL RECORD NO.:  [id number]          PATIENT TYPE:  INP   LOCATION:  3735                         FACILITY:  [facility]   PHYSICIAN:  Learta Codding, M.D. LHCDATE OF BIRTH:  [birth year]   DATE OF ADMISSION:  05/28/2005  DATE OF DISCHARGE:  05/31/2005                                 DISCHARGE SUMMARY   PRIMARY DIAGNOSES:  1.  Acute inferior myocardial infarction status post percutaneous      transluminal coronary angioplasty and bare metal stent to the ostial      right coronary artery.  2.  Dyslipidemia, with a total cholesterol of 163, triglycerides 151, high-      density lipoprotein 27, low-density lipoprotein 106.  Lipitor 80 started      this admission, with liver function tests needed in six weeks.  3.  Mild left ventricular dysfunction with an ejection fraction of 50-55%.  4.  Ongoing tobacco use.  5.  Peripheral vascular disease, status post carotid endarterectomy.  6.  History of noncompliance with medications secondary to side effects      (fatigue).  7.  Family history of myocardial infarction in his father who died suddenly      with a myocardial infarction at age 67.   PROCEDURES:  1.  Cardiac catheterization.  2.  Coronary arteriogram.  3.  Left ventriculogram.  4.  Percutaneous intervention and stent to one vessel.   HOSPITAL COURSE:  Edward Brown is a 60 year old male with known vascular disease  and nonobstructive coronary artery disease by catheterization at [facility].  He had four to five days of substernal chest pain  described at tightness.  When the pain increased from a 2/10 to an 8/10, he  went to the emergency room.  He was bradycardic with a pulse in the 40s as  well as hypotensive with a systolic blood pressure in the 90s.  He EKG  showed inferior ST elevation with reciprocal anterolateral T wave changes.  His initial cardiac enzymes were positive.  He initially went to [facility] where he received medical therapy for his chest pain, and it  improved.  He was transferred urgently to [facility] and scheduled  for catheterization.   His EKG changes resolved, but his cardiac enzymes remained elevated.  He had  a cardiac catheterization on May 29, 2005 that showed a 99% RCA which  was treated with PTCA and a bare metal stent.  He had multiple other lesions  between 20 and 50% in the distal RCA, acute marginal, circumflex, OM, and  the LAD.  He had inferior akinesis on his ventriculogram and an EF of 50-  55%.   He tolerated the procedure well, and the sheath was removed without  difficulty.  His lipid profile was as described above, and he had Lipitor 80  added to his medication regimen.  He was counseled regarding smoking  cessation and encouraged to be compliant with his medications.  He was  started on a low-dose beta-blocker, but no ACE inhibitor was used  because  his systolic blood pressure was 90-100 on low-dose beta-blocker.   Edward Brown was seen by cardiac rehabilitation and educated regarding MI, stent  restrictions, risk factor reduction, and lifestyle modifications.  He is  referred for outpatient cardiac rehabilitation.   By May 31, 2005, Edward Brown was ambulating without chest pain or shortness  of breath.  He was evaluated by Dr. Andee Lineman and considered stable for  discharge on May 31, 2005 with outpatient followup arranged.  Time spent  at discharge was 41 minutes.   ACTIVITY:  His activity level is to be increased gradually, and he is to  exercise per rehabilitation guidelines.   DISCHARGE INSTRUCTIONS:  He is to call our office for problems with the  catheterization site.   FOLLOW UP:  He is to follow up with Dr. Andee Lineman in the [facility] office, and a  message has been left with the office to call him with an appointment.  He  is to follow up with Dr. Nobie Putnam as well.   DISCHARGE MEDICATIONS:  1.  Celexa 40 mg daily.  2.   Aspirin 325 mg daily.  3.  Plavix 75 mg daily.  4.  Lipitor 80 mg daily.  5.  Toprol-XL 25 mg daily.  6.  Nitroglycerin sublingual p.r.n.      Theodore Demark, P.A. [facility]      Learta Codding, M.D. [facility]  Electronically Signed    RB/MEDQ  D:  05/31/2005  T:  06/01/2005  Job:  [id number]   cc:   [name], M.D.  Fax: [phone]   [facility]  [place], [place]

## 2010-09-20 NOTE — H&P (Signed)
NAME:  Edward Brown, Edward Brown                            ACCOUNT NO.:  [id number]   MEDICAL RECORD NO.:  [id number]                   PATIENT TYPE:  INP   LOCATION:  2001                                 FACILITY:  [facility]   PHYSICIAN:  Duke Salvia, M.D.               DATE OF BIRTH:  [birth year]   DATE OF ADMISSION:  08/28/2003  DATE OF DISCHARGE:                                HISTORY & PHYSICAL   Edward Brown is referred from the [facility] emergency room because of angina and  positive enzymes.   HISTORY OF PRESENT ILLNESS:  Edward Brown is a 60 year old gentleman who first  came to [facility] a week or so ago in the context of abnormal carotid  Dopplers for which he was then subsequently referred for Cardiolite  scanning, results of which are not available.  He has a past cardiac history  notable for a small MI 20 years or so ago at which time he underwent  catheterization and rotorutor.  Since that time he has had problems with  intermittent and infrequent recurrent midsternal chest discomfort.  He had  an episode a couple of weeks ago that took him to see his primary physician  who referred him for further cardiac evaluation.  Today he had a recurrence  of the same discomfort while drinking coffee, it persisted despite a  nitroglycerin and became slight severe.  It did not radiate.  It was  associated with some shortness of breath.  He subsequently went to the  [facility] emergency room where he was given nitroglycerin with resolution of  his pain.  Enzymes demonstrated an abnormal CK at 262 with an MB fraction of  23.5 and troponin's on presentation were  normal.  The patient has had no  recurrent chest discomfort.  The patient does have some dyspnea on exertion  recently.  He does not have symptoms of claudication.  He appropriately also  has dyslipidemia for which he takes Lipitor.  He does not have hypertension.   SOCIAL HISTORY:  He smokes more than 3 packs a day and has for many years.  He has  a 55-year-old son.   He has had some problems with orthostatic light headedness.  He has a  history of tacky palpitations times years associated with chest pain but no  shortness of breath and no presyncope.  He also in the last couple of weeks  has had problems with epigastric periumbilical discomfort that has been  related to eating and for which has been presumed to be secondary to ulcer  disease.   PAST MEDICAL HISTORY:  Notable primarily as above.   PAST SURGICAL HISTORY:  Notable for some hand surgery.   REVIEW OF SYSTEMS:  Is largely negative apart from the above.  His  neurological review is negative.  His constitutional review is negative.  He  did have some metal injury into his left  arm recently.  He has significant  problems with his low back and a limp in his left leg.   MEDICATIONS:  He takes Lipitor (no other medications).   ALLERGIES:  No known drug allergies.   PHYSICAL EXAMINATION:  VITAL SIGNS:  His blood pressure is low this evening  at 87/67, his pulse was 68.  HEENT:  Exam demonstrated no xanthoma.  NECK:  Veins were flat.  The carotids were brisk bilaterally with a very  loud right bruit and a somewhat quieter left bruit.  BACK:  Without kyphosis or scoliosis, there were occasional crackles and  wheezes.  HEART:  Sounds were regular with an early systolic murmur heard at the left  upper sternal border.  The PMI was non displaced.  ABDOMEN:  Was soft with active bowel sounds, I thought I could initially  feel his abdominal pulsation and then could not appreciate this well.  EXTREMITIES:  His femoral pulses were 2+, distal pulses were surprisingly  intact.  There was no clubbing, cyanosis or edema.  NEUROLOGICAL EXAM:  Grossly normal.   Electrocardiogram dated tonight demonstrated sinus rhythm at 60 with  intervals of 0.14/  _____/0.4 to 0.  He developed 1 mm of ST segment elevation in leads V2 and  V3.  The electrocardiogram was otherwise unremarkable.   The axis was  leftward and -50.   LABORATORY DATA:  Demonstrated the troponin's and CK as noted previously.  Hemoglobin was 18.2 with a hematocrit of 53.8.  Sugar was 94, BUN was 8 and  creatinine 0.9.   IMPRESSION:  1. An ST EMI.  2. Coronary artery disease.     a. Prior small myocardial infarction.     b. Prior percutaneous coronary intervention at [facility] about 20 years        ago.  3. Bilateral carotid bruits, severe.  Dopplers from August 18, 2003     demonstrating left greater than right.  4. Carotid risk factors notable for cigarettes and dyslipidemia.  5. Dyspnea on exertion.  6. Anxiety.  7. Tacky palpitations times years, intermittent, probably supraventricular     tachycardia.  8. Epigastric periumbilical discomfort caused from peptic ulcer disease.  9. Secondary polycythemia.  10.      Low back pain and injury with a chronic limp.   DISCUSSION:  Edward Brown has severe vascular disease.  He has significant  carotid stenosis that will need revascularization.  He also has non STE MI  myocardial infarction and likely has severe coronary disease that has  progressed over the 20 years in the context of unmitigated risk factors.   He will need extensive vascular evaluation and I will need to defer to  colleagues as to what will need to be done, and the timing of interventions  to his heart and his neck.   This will be further complicated by his anxiety related to his cigarette use  and we will try to use some benzodiazepines to try and help with this.   PLAN:  1. We will plan to admit him and try to get the Cardiolite scan from the     office.  2. Serial enzymes.  3. Heparin, Integrilin, nitroglycerin, Lopressor and aspirin.  4. Will hold on Plavix.  5. Get reports from [facility], and CVTS consults in the     morning.  6. Needs catheterization.  7. Will need benzodiazepines for cigarette cessation while in hospital.  8. PTI therapy. 9. Fasting lipids.   10.  Check TSH.                                                Duke Salvia, M.D.    SCK/MEDQ  D:  08/28/2003  T:  08/29/2003  Job:  [id number]   cc:   [name], M.D.  [address]  [place]  [place] [address]  Fax: [phone]   Vida Roller, M.D.  Fax: [phone]   CVTS

## 2010-10-08 ENCOUNTER — Other Ambulatory Visit (HOSPITAL_COMMUNITY): Payer: Self-pay | Admitting: Internal Medicine

## 2010-10-08 DIAGNOSIS — M545 Low back pain: Secondary | ICD-10-CM

## 2010-10-10 ENCOUNTER — Ambulatory Visit (HOSPITAL_COMMUNITY)
Admission: RE | Admit: 2010-10-10 | Discharge: 2010-10-10 | Disposition: A | Discharge status: Home / Self Care | Payer: BC Managed Care – PPO | Source: Ambulatory Visit | Attending: Internal Medicine | Admitting: Internal Medicine

## 2010-10-10 ENCOUNTER — Ambulatory Visit (HOSPITAL_COMMUNITY): Payer: BC Managed Care – PPO

## 2010-10-10 DIAGNOSIS — M545 Low back pain, unspecified: Secondary | ICD-10-CM | POA: Insufficient documentation

## 2010-10-10 DIAGNOSIS — M5380 Other specified dorsopathies, site unspecified: Secondary | ICD-10-CM | POA: Insufficient documentation

## 2010-10-10 DIAGNOSIS — R209 Unspecified disturbances of skin sensation: Secondary | ICD-10-CM | POA: Insufficient documentation

## 2010-10-16 ENCOUNTER — Encounter: Payer: Self-pay | Admitting: Cardiology

## 2010-10-17 ENCOUNTER — Other Ambulatory Visit: Payer: Self-pay | Admitting: Cardiology

## 2010-11-22 ENCOUNTER — Ambulatory Visit (INDEPENDENT_AMBULATORY_CARE_PROVIDER_SITE_OTHER): Payer: BC Managed Care – PPO | Admitting: Cardiology

## 2010-11-22 ENCOUNTER — Encounter: Payer: Self-pay | Admitting: Cardiology

## 2010-11-22 ENCOUNTER — Encounter: Payer: Self-pay | Admitting: *Deleted

## 2010-11-22 VITALS — BP 156/80 | HR 60 | Ht 68.0 in | Wt 170.8 lb

## 2010-11-22 DIAGNOSIS — F17200 Nicotine dependence, unspecified, uncomplicated: Secondary | ICD-10-CM

## 2010-11-22 DIAGNOSIS — I2510 Atherosclerotic heart disease of native coronary artery without angina pectoris: Secondary | ICD-10-CM

## 2010-11-22 DIAGNOSIS — I679 Cerebrovascular disease, unspecified: Secondary | ICD-10-CM

## 2010-11-22 DIAGNOSIS — E785 Hyperlipidemia, unspecified: Secondary | ICD-10-CM

## 2010-11-22 DIAGNOSIS — Z01810 Encounter for preprocedural cardiovascular examination: Secondary | ICD-10-CM

## 2010-11-22 DIAGNOSIS — Z951 Presence of aortocoronary bypass graft: Secondary | ICD-10-CM

## 2010-11-22 MED ORDER — PRAVASTATIN SODIUM 20 MG PO TABS: 20.0000 mg | ORAL_TABLET | Freq: Every evening | ORAL | Status: DC

## 2010-11-22 MED ORDER — NITROGLYCERIN 0.4 MG SL SUBL: 0.4000 mg | SUBLINGUAL_TABLET | SUBLINGUAL | Status: DC | PRN

## 2010-11-22 NOTE — Patient Instructions (Signed)
   Dobutamine stress echo If the results of your test are normal or stable, you will receive a letter.  If they are abnormal, the nurse will contact you by phone. Begin Pravachol 20mg  every evening  Your physician wants you to follow up in: 6 months.  You will receive a reminder letter in the mail one-two months in advance.  If you don't receive a letter, please call our office to schedule the follow up appointment.

## 2010-11-23 NOTE — Assessment & Plan Note (Signed)
The patient is not on statin and we started him on Pravachol 20 minutes by mouth each bedtime

## 2010-11-23 NOTE — Assessment & Plan Note (Signed)
The patient used to smoke 3 packs a day he cut down now to 1 pack a day. I congratulated him about this and further counseling for less than 10 minutes regarding tobacco cessation

## 2010-11-23 NOTE — Progress Notes (Signed)
HPI  Patient is a 60 year old male feels no better with PTSD. The patient has a history of coronary artery disease and is status post aborted inferior wall myocardial infarction with bare-metal stenting of subtotal proximal RCA in January of 2007 he has otherwise residual nonobstructive CAD. The patient denies any chest pain short of breath orthopnea or PND. He continues to smoke one pack a day. He also significant peripheral vascular disease and had redo carotid surgery on the right side. This total occlusion of the left side. The patient complains that he is very tired. 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 The patient the patient reports no claudication, he has atrophy in both lower extremities related to spinal injury. He has chronic back pain and knee surgery. He scheduled to undergo surgery [facility]. The patient has not had a dobutamine echo or any type of stress test in quite a while. He is also not on statin drug lowering therapy. Otherwise from a cardiovascular standpoint is stable and he denies any orthopnea PND palpitations or syncope.  Allergies  Allergen Reactions  . Codeine     REACTION: stomach upset  . Hydromorphone Hcl     REACTION: violent behavior  . Promethazine Hcl     REACTION: violent behaviors    Current Outpatient Prescriptions on File Prior to Visit  Medication Sig Dispense Refill  . enalapril (VASOTEC) 20 MG tablet take 1 tablet by mouth once daily  90 tablet  0  . aspirin 81 MG tablet Take 81 mg by mouth daily.          Past Medical History  Diagnosis Date  . Hypertension     unspecified  . Hyperlipidemia, mixed   . CAD (coronary artery disease)     Native Vessel, Severe single-vessel coronary artery disease. a. status post aborted inferior myocardial infarction, followed by a bare-metal stenting of subtotal proximal RCA, January 2007. b. Residual nonobructive coronary artery disease. c. EF 50-55%; inferobasal akineses  . Cerebrovascular disease      a. Status post prior bilateral carotid endarterectomy, with residual bilateral bruits. b. 50-69% right ICA stenosis, February 2009. c Bilateral, proximal ECA occlusion with distal collateralization  . Tobacco abuse   . Dyslipidemia     Past Surgical History  Procedure Date  . Carotid endarterectomy     Redo; right carotid endarterectomy with replacement of carotid artery with interpostion greater saphenous vein from the left leg    Family History  Problem Relation Age of Onset  . Coronary artery disease      History   Social History  . Marital Status: Married    Spouse Name: N/A    Number of Children: N/A  . Years of Education: N/A   Occupational History  . Not on file.   Social History Main Topics  . Smoking status: Smoker, Current Status Unknown -- 1.0 packs/day  . Smokeless tobacco: Never Used  . Alcohol Use: No  . Drug Use: No  . Sexually Active: Not on file   Other Topics Concern  . Not on file   Social History Narrative  . No narrative on file    PHYSICAL EXAM BP 156/80  Pulse 60  Ht 5\' 8"  (1.727 m)  Wt 170 lb 12 oz (77.452 kg)  BMI 25.96 kg/m2  General: Well-developed, well-nourished in no distress Head: Normocephalic and atraumatic Eyes:PERRLA/EOMI intact, conjunctiva and lids normal Ears: No deformity or lesions Mouth:normal dentition, normal posterior pharynx Neck: Supple, no JVD.  No masses, thyromegaly or abnormal cervical nodes Lungs: Normal breath sounds bilaterally without wheezing.  Normal percussion Cardiac: regular rate and rhythm with normal S1 and S2, no S3 or S4.  PMI is normal.  No pathological murmurs Abdomen: Normal bowel sounds, abdomen is soft and nontender without masses, organomegaly or hernias noted.  No hepatosplenomegaly MSK: Back normal, normal gait muscle strength and tone normal Vascular: Pulse is normal in all 4 extremities Extremities: Atrophy below the knee of the calf muscles bilaterally  Neurologic: Alert and  oriented x 3 Skin: Intact without lesions or rashes Lymphatics: No significant adenopathy Psychologic: Normal affect   ECG: No acute ischemic changes. Normal sinus rhythm  ASSESSMENT AND PLAN

## 2010-11-23 NOTE — Assessment & Plan Note (Signed)
This patient is scheduled for back surgery. We'll: Dobutamine echocardiogram for further risk stratification. If within normal limits patient can proceed at low risk for cardiovascular surgery. Is currently not on Plavix and is holding aspirin for surgery.

## 2010-11-23 NOTE — Assessment & Plan Note (Signed)
Followed by vascular surgery. Details as outlined above

## 2010-11-25 ENCOUNTER — Other Ambulatory Visit: Payer: Self-pay | Admitting: Cardiology

## 2010-11-25 DIAGNOSIS — Z01810 Encounter for preprocedural cardiovascular examination: Secondary | ICD-10-CM

## 2010-11-25 DIAGNOSIS — I2510 Atherosclerotic heart disease of native coronary artery without angina pectoris: Secondary | ICD-10-CM

## 2010-11-28 ENCOUNTER — Telehealth: Payer: Self-pay | Admitting: *Deleted

## 2010-11-28 NOTE — Telephone Encounter (Signed)
Dobutamine stress echo Set for 12-03-2010 @ [facility] Checking percert

## 2010-11-28 NOTE — Telephone Encounter (Signed)
Auth # [id number]  Test has to be done on or before 12/03/10.  Insurance terminates after 12/03/10.

## 2010-12-03 DIAGNOSIS — I2510 Atherosclerotic heart disease of native coronary artery without angina pectoris: Secondary | ICD-10-CM

## 2010-12-04 ENCOUNTER — Encounter: Payer: Self-pay | Admitting: *Deleted

## 2010-12-04 HISTORY — PX: SPINE SURGERY: SHX786

## 2010-12-10 ENCOUNTER — Telehealth: Payer: Self-pay | Admitting: *Deleted

## 2010-12-10 NOTE — Telephone Encounter (Signed)
Message copied by Murriel Hopper on Tue Dec 10, 2010  2:16 PM ------      Message from: Murriel Hopper      Created: Tue Dec 10, 2010  2:16 PM       Left message to return call.

## 2010-12-10 NOTE — Telephone Encounter (Signed)
Left message to return call 

## 2010-12-11 ENCOUNTER — Telehealth: Payer: Self-pay | Admitting: *Deleted

## 2010-12-11 ENCOUNTER — Encounter (HOSPITAL_COMMUNITY)
Admission: RE | Admit: 2010-12-11 | Discharge: 2010-12-11 | Disposition: A | Discharge status: Home / Self Care | Payer: BC Managed Care – PPO | Source: Ambulatory Visit | Attending: Neurological Surgery | Admitting: Neurological Surgery

## 2010-12-11 ENCOUNTER — Ambulatory Visit (HOSPITAL_COMMUNITY)
Admission: RE | Admit: 2010-12-11 | Discharge: 2010-12-11 | Disposition: A | Discharge status: Home / Self Care | Payer: BC Managed Care – PPO | Source: Ambulatory Visit | Attending: Neurological Surgery | Admitting: Neurological Surgery

## 2010-12-11 ENCOUNTER — Other Ambulatory Visit (HOSPITAL_COMMUNITY): Payer: Self-pay | Admitting: Neurological Surgery

## 2010-12-11 DIAGNOSIS — Z01812 Encounter for preprocedural laboratory examination: Secondary | ICD-10-CM | POA: Insufficient documentation

## 2010-12-11 DIAGNOSIS — Z01818 Encounter for other preprocedural examination: Secondary | ICD-10-CM | POA: Insufficient documentation

## 2010-12-11 DIAGNOSIS — M19019 Primary osteoarthritis, unspecified shoulder: Secondary | ICD-10-CM | POA: Insufficient documentation

## 2010-12-11 DIAGNOSIS — Z01811 Encounter for preprocedural respiratory examination: Secondary | ICD-10-CM

## 2010-12-11 DIAGNOSIS — Z01810 Encounter for preprocedural cardiovascular examination: Secondary | ICD-10-CM | POA: Insufficient documentation

## 2010-12-11 DIAGNOSIS — I498 Other specified cardiac arrhythmias: Secondary | ICD-10-CM | POA: Insufficient documentation

## 2010-12-11 LAB — PROTIME-INR
INR: 0.89 (ref 0.00–1.49)
Prothrombin Time: 12.2 seconds (ref 11.6–15.2)

## 2010-12-11 LAB — BASIC METABOLIC PANEL
Calcium: 9.5 mg/dL (ref 8.4–10.5)
GFR calc non Af Amer: >60 mL/min (ref >60)
Glucose, Bld: 88 mg/dL (ref 70–99)
Sodium: 139 mEq/L (ref 135–145)

## 2010-12-11 LAB — CBC
MCH: 33 pg (ref 26.0–34.0)
Platelets: 184 10*3/uL (ref 150–400)
RBC: 4.57 MIL/uL (ref 4.22–5.81)
WBC: 9.2 10*3/uL (ref 4.0–10.5)

## 2010-12-11 LAB — SURGICAL PCR SCREEN: Staphylococcus aureus: NEGATIVE

## 2010-12-11 NOTE — Telephone Encounter (Signed)
Message copied by Murriel Hopper on Wed Dec 11, 2010 11:03 AM ------      Message from: Murriel Hopper      Created: Tue Dec 10, 2010  2:16 PM       Left message to return call.

## 2010-12-11 NOTE — Telephone Encounter (Signed)
Left message to return call 

## 2010-12-13 NOTE — Telephone Encounter (Signed)
Patient notified on 12/11/10 at 4:52.

## 2010-12-19 ENCOUNTER — Ambulatory Visit (HOSPITAL_COMMUNITY)
Admission: RE | Admit: 2010-12-19 | Discharge: 2010-12-19 | Disposition: A | Discharge status: Home / Self Care | Payer: BC Managed Care – PPO | Source: Ambulatory Visit | Attending: Neurological Surgery | Admitting: Neurological Surgery

## 2010-12-19 ENCOUNTER — Inpatient Hospital Stay (HOSPITAL_COMMUNITY): Payer: BC Managed Care – PPO

## 2010-12-19 ENCOUNTER — Inpatient Hospital Stay (HOSPITAL_COMMUNITY)
Admission: RE | Admit: 2010-12-19 | Discharge: 2010-12-21 | DRG: 755 | Disposition: A | Discharge status: Home / Self Care | Payer: BC Managed Care – PPO | Source: Ambulatory Visit | Attending: Neurological Surgery | Admitting: Neurological Surgery

## 2010-12-19 ENCOUNTER — Other Ambulatory Visit (HOSPITAL_COMMUNITY): Payer: Self-pay | Admitting: Neurological Surgery

## 2010-12-19 DIAGNOSIS — D62 Acute posthemorrhagic anemia: Secondary | ICD-10-CM | POA: Diagnosis present

## 2010-12-19 DIAGNOSIS — Z7982 Long term (current) use of aspirin: Secondary | ICD-10-CM

## 2010-12-19 DIAGNOSIS — F17200 Nicotine dependence, unspecified, uncomplicated: Secondary | ICD-10-CM | POA: Diagnosis present

## 2010-12-19 DIAGNOSIS — Z01812 Encounter for preprocedural laboratory examination: Secondary | ICD-10-CM

## 2010-12-19 DIAGNOSIS — M549 Dorsalgia, unspecified: Secondary | ICD-10-CM

## 2010-12-19 DIAGNOSIS — M51379 Other intervertebral disc degeneration, lumbosacral region without mention of lumbar back pain or lower extremity pain: Secondary | ICD-10-CM | POA: Diagnosis present

## 2010-12-19 DIAGNOSIS — Z01818 Encounter for other preprocedural examination: Secondary | ICD-10-CM

## 2010-12-19 DIAGNOSIS — M5126 Other intervertebral disc displacement, lumbar region: Secondary | ICD-10-CM | POA: Diagnosis present

## 2010-12-19 DIAGNOSIS — M5137 Other intervertebral disc degeneration, lumbosacral region: Secondary | ICD-10-CM | POA: Diagnosis present

## 2010-12-19 DIAGNOSIS — M47817 Spondylosis without myelopathy or radiculopathy, lumbosacral region: Principal | ICD-10-CM | POA: Diagnosis present

## 2010-12-19 LAB — TYPE AND SCREEN: ABO/RH(D): A POS

## 2010-12-20 LAB — BASIC METABOLIC PANEL WITH GFR
BUN: 14 mg/dL (ref 6–23)
CO2: 30 meq/L (ref 19–32)
Calcium: 8 mg/dL — ABNORMAL LOW (ref 8.4–10.5)
Chloride: 104 meq/L (ref 96–112)
Creatinine, Ser: 0.77 mg/dL (ref 0.50–1.35)
GFR calc Af Amer: >60 mL/min (ref >60)
GFR calc non Af Amer: >60 mL/min (ref >60)
Glucose, Bld: 135 mg/dL — ABNORMAL HIGH (ref 70–99)
Potassium: 4.2 meq/L (ref 3.5–5.1)
Sodium: 137 meq/L (ref 135–145)

## 2010-12-20 LAB — CBC
HCT: 33.5 % — ABNORMAL LOW (ref 39.0–52.0)
Hemoglobin: 11.5 g/dL — ABNORMAL LOW (ref 13.0–17.0)
MCH: 32.6 pg (ref 26.0–34.0)
MCHC: 34.3 g/dL (ref 30.0–36.0)
MCV: 94.9 fL (ref 78.0–100.0)
Platelets: 141 K/uL — ABNORMAL LOW (ref 150–400)
RBC: 3.53 MIL/uL — ABNORMAL LOW (ref 4.22–5.81)
RDW: 13 % (ref 11.5–15.5)
WBC: 11.5 K/uL — ABNORMAL HIGH (ref 4.0–10.5)

## 2010-12-23 ENCOUNTER — Ambulatory Visit (HOSPITAL_COMMUNITY)
Admission: RE | Admit: 2010-12-23 | Discharge: 2010-12-23 | Disposition: A | Discharge status: Home / Self Care | Payer: BC Managed Care – PPO | Source: Ambulatory Visit | Attending: Neurological Surgery | Admitting: Neurological Surgery

## 2010-12-23 DIAGNOSIS — M7989 Other specified soft tissue disorders: Secondary | ICD-10-CM | POA: Insufficient documentation

## 2011-01-09 NOTE — Op Note (Signed)
NAME:  [name], [name]                  ACCOUNT NO.:  [id number]  MEDICAL RECORD NO.:  [id number]  LOCATION:  XRAY                         FACILITY:  [facility]  PHYSICIAN:  Stefani Dama, M.D.  DATE OF BIRTH:  09/02/1950  DATE OF PROCEDURE:  12/19/2010 DATE OF DISCHARGE:  12/19/2010                              OPERATIVE REPORT   PREOPERATIVE DIAGNOSIS:  Lumbar spondylosis and stenosis with spondylolisthesis at L3-4, L4-5, and L5-S1, herniated nucleus pulposus at L4-L5 with severe lumbar radiculopathy, neurogenic claudication.  POSTOPERATIVE DIAGNOSIS:  Lumbar spondylosis and stenosis with spondylolisthesis at L3-4, L4-5, and L5-S1, herniated nucleus pulposus at L4-L5 with severe lumbar radiculopathy, neurogenic claudication.  PROCEDURES:  Decompression of L3, L4, and L5 via laminectomy of L3, L4, and L5, decompression of the L3, L4, L5, and S1 nerve roots bilaterally with more dissection than required for simple posterior interbody arthrodesis. Posterior interbody arthrodesis with PEEK spacers, local autograft, and allograft at L3-4, L4-5, and L5-S1. Segmental fixation L3 to sacrum with pedicle screws and posterolateral arthrodesis using local autograft and allograft at L3-S1.  SURGEON:  Stefani Dama, MD  FIRST ASSISTANT:  Danae Orleans. Venetia Maxon, MD  ANESTHESIA:  General endotracheal.  INDICATIONS:  Edward Brown is a 60 year old individual who has had significant problems with back pain, bilateral lower extremity pain, and evidence of advanced spondylitic stenosis at L4-5 secondary to a spondylolisthesis with a large extruded fragment of disk, worse on the right than on the left.  These failed efforts at conservative management over a number of years now, and he has been advised regarding surgery to decompress and stabilize from L3 down to the sacrum as these are the areas of worst disk degeneration.  He is now taken to the operating room.  PROCEDURE:  The patient was brought to the  operating room supine on the stretcher.  After smooth induction of general endotracheal anesthesia, he was turned prone.  Back was prepped with alcohol and DuraPrep and draped in a sterile fashion and midline incision was created and carried down to the lumbodorsal fascia which was opened on either side of midline to expose the spinous processes of L3, L4, L5, and L4-L5 were marked positively with radiograph and then dissection was carried out into the interlaminar spaces to expose L3, L4, and L5 completely. Complete laminectomies of L4 and L5 were performed using combination of rongeurs, high-speed drill with an acorn bit to thin the bone of the lamina and remove it in a piecemeal fashion using 2 and 3-mm Kerrison punches.  L3 was removed largely by removing the spinous process and posterior inferior laminar arch, however, we left rim of the superior laminar arch along with a portion of the spinous process to articulate interspinously with L2-L3.  The laminotomies were carried out to the facets and complete facetectomies were performed to expose the L3-L4 disk space.  The L3 nerve root was identified and protected superiorly. The L4 nerve root was protected inferiorly.  The disk space here was noted to be moderately degenerated.  The facets, however, were significantly hypertrophied at L3-L4 and removal of this yielded good decompression of the central canal and the takeoffs of the L4  nerve roots inferiorly.  The disk space at L3-4 was then entered and a complete diskectomy was performed.  A series of spacers were used to increase the size of the interspace and ultimately 12-mm peak spacers could be placed into the interspace.  The dissection once completed in the disk space allowed placement of a spacer on one side as a trial and then a PEEK spacer filled with combination of Vitoss bone sponge soaked with PureGen and autograft bone from the laminectomy that was performed was packed into  the interspace.  The opposite side was then instrumented in a similar fashion.  Attention was turned to L5 and L4 where a large central disk herniation eccentric off to the left side was encountered and this was removed.  Disk space was entered and a complete diskectomy was performed here.  Interbody spacers were then sized and it was felt that 10-mm spacers would fit best in the space.  Similar combination of autograft mixed with Vitoss bone sponge and PureGen was placed into the interspace by placing first the cages and then additional bone graft and Vitoss in the interspace.  Attention was then turned to L5-S1, the disk space was noted be fairly narrowed and degenerated and 10-mm spacers were placed into the L5-S1 disk space after complete diskectomy was performed.  Care was taken to make sure that each of the nerve roots were well decompressed; however, the stenosis at the L5 nerve root was the most severe and this was decompressed by removing large quantities of surrounding ligamentous material from the region of the foramen. Pedicle entry sites were then chosen at L3, L4, L5, and the sacrum and under fluoroscopic guidance, 6.5 x 50-mm screws were placed at L3 and 6.5 x 45-mm screws were placed at L4 and L5, and 6.5 x 45-mm sacral screws were placed in the sacrum.  A 90-mm straight rods were used to connect the screw heads, and these were then connected and locked in the neutral fashion.  Final radiographs were obtained in the AP and lateral projection.  The lateral gutters which were previously decorticated were packed with remainder of the autologous bone, in addition additional 10 mL of Vitoss bone sponge was packed in the lateral gutters to help retain the bone graft in these areas.  Care was taken to make sure that the common dura to the L3, the L4, the L5, and the S1 nerve roots were well decompressed and travel out of the foramen.  With this, the lumbodorsal fascia was closed  with #1 Vicryl in interrupted fashion, 2-0 Vicryl was used in the subcutaneous tissues, 3-0 Vicryl subcuticularly, and Dermabond was placed on the skin.  Blood loss for the procedure was estimated 900 mL, 400 mL of Cell Saver blood was returned to the patient.  The patient tolerated the procedure well and was returned to recovery room in stable condition.     Stefani Dama, M.D.     Merla Riches  D:  12/19/2010  T:  12/20/2010  Job:  [id number]  Electronically Signed by Barnett Abu M.D. on 01/09/2011 03:16:42 PM

## 2011-02-06 ENCOUNTER — Other Ambulatory Visit: Payer: Self-pay | Admitting: Cardiology

## 2011-02-23 DIAGNOSIS — R079 Chest pain, unspecified: Secondary | ICD-10-CM

## 2011-02-24 ENCOUNTER — Telehealth: Payer: Self-pay | Admitting: Cardiology

## 2011-02-27 NOTE — Telephone Encounter (Signed)
Left message to return call 

## 2011-03-03 ENCOUNTER — Ambulatory Visit (INDEPENDENT_AMBULATORY_CARE_PROVIDER_SITE_OTHER): Payer: BC Managed Care – PPO | Admitting: Cardiology

## 2011-03-03 ENCOUNTER — Encounter: Payer: Self-pay | Admitting: Cardiology

## 2011-03-03 VITALS — BP 136/73 | HR 54 | Ht 68.0 in | Wt 174.0 lb

## 2011-03-03 DIAGNOSIS — F4310 Post-traumatic stress disorder, unspecified: Secondary | ICD-10-CM

## 2011-03-03 DIAGNOSIS — I498 Other specified cardiac arrhythmias: Secondary | ICD-10-CM

## 2011-03-03 DIAGNOSIS — I471 Supraventricular tachycardia: Secondary | ICD-10-CM

## 2011-03-03 DIAGNOSIS — I10 Essential (primary) hypertension: Secondary | ICD-10-CM

## 2011-03-03 DIAGNOSIS — F17200 Nicotine dependence, unspecified, uncomplicated: Secondary | ICD-10-CM

## 2011-03-03 MED ORDER — DILTIAZEM HCL ER COATED BEADS 120 MG PO CP24: 120.0000 mg | ORAL_CAPSULE | Freq: Every day | ORAL | Status: DC

## 2011-03-03 MED ORDER — METOPROLOL TARTRATE 25 MG PO TABS: 12.5000 mg | ORAL_TABLET | ORAL | Status: DC | PRN

## 2011-03-03 MED ORDER — CITALOPRAM HYDROBROMIDE 40 MG PO TABS: 40.0000 mg | ORAL_TABLET | Freq: Every day | ORAL | Status: AC

## 2011-03-03 NOTE — Assessment & Plan Note (Signed)
Patient now not on ACE inhibitor anymore but switch to Cardizem for below reasons.

## 2011-03-03 NOTE — Progress Notes (Signed)
History of present illness:  The patient is a 60 year old male with history of coronary artery disease, status post PCI 2007, removal vascular disease, hypertension dyslipidemia and ongoing tobacco use. The patient was recently admitted with an episode of supraventricular tachycardia/narrow QRS tachycardia. The episode was aborted with intravenous adenosine. According to the fellow's note this appeared to be secondary to AV nodal reentry tachycardia. The patient indeed has a long-standing history of similar palpitations dating back many years. Now rather infrequent and typically only occur once a year. This episode however appear to last longer and may very concerned. There was some associated chest tightness. The patient had several medication changes in the hospital and was started on metoprolol. Also his citalopram was discontinued and changed to venlafaxine. The patient states that his not feeling well on the latter medication. He feels very anxious, short tempered and nervous. At the same time he also feels very fatigued and weak. He denies any exertional chest pain.   Allergies, family history and social history: As documented in chart and reviewed  Medications: Documented and reviewed in chart.  Past medical history: Reviewed and see problem list below   Review of systems: No nausea or vomiting. No fever or chills. No melena or hematochezia. No dysuria or frequency. No recurrent palpitations since hospital admission   Physical examination : Vital signs documented below General: Well-nourished white male in no apparent distress HEENT: Normal carotid upstroke with bilateral carotid endarterectomy scars that are well-healed. No thyromegaly nonnodular thyroid. Lungs: Clear breath sounds bilaterally with no scattered wheezes Heart: Regular rate and rhythm with normal S1-S2 no murmur rubs or gallops. Abdomen: Soft nontender no rebound or guarding and good bowel sounds  Extremity exam: No  cyanosis clubbing or edema Neurologic: Alert and oriented grossly nonfocal Psychiatric: Normal affect Vascular exam: Normal peripheral pulses bilaterally

## 2011-03-03 NOTE — Assessment & Plan Note (Signed)
Patient can go back on citalopram 40 mg by mouth daily. I gave him a handout of the FDA alert, but given the fact that he did not have a recent myocardial infarction or heart failure a prolonged QTC, he can continue on citalopram as long as he does not have a dose higher than 40 mg a day. No prior QTC prolongation on citalopram. Discontinue venlafaxine

## 2011-03-03 NOTE — Assessment & Plan Note (Signed)
Patient doesn't appear to be tolerating the Toprol very well. We will switch him to Cardizem CD 120 mg a day. I also told him that he can take metoprolol 25 mg by mouth when necessary palpitations. At this point given the infrequency of his symptoms no indication to proceed with catheter ablation yet.

## 2011-03-03 NOTE — Patient Instructions (Addendum)
Your physician wants you to follow-up in: 6 months. You will receive a reminder letter in the mail one-two months in advance. If you don't receive a letter, please call our office to schedule the follow-up appointment. Use Metoprolol only as needed. 1 tablet (25 mg) in a 24 hour period for palpitation as needed. Stop Effexor. Resume Celexa 40 mg daily. Start Cardizem 120 mg daily.

## 2011-03-03 NOTE — Assessment & Plan Note (Signed)
Patient was counseled.

## 2011-03-06 NOTE — Telephone Encounter (Signed)
Patient in for office visit on 10/29 with GD.

## 2011-12-19 IMAGING — CR DG LUMBAR SPINE 1V
1 series · 1 of 1 positions shown · non-contrast
Comparison: 10/10/2010

CLINICAL DATA: Lumbar laminectomy.

LUMBAR SPINE - 1 VIEW

[view not recorded]
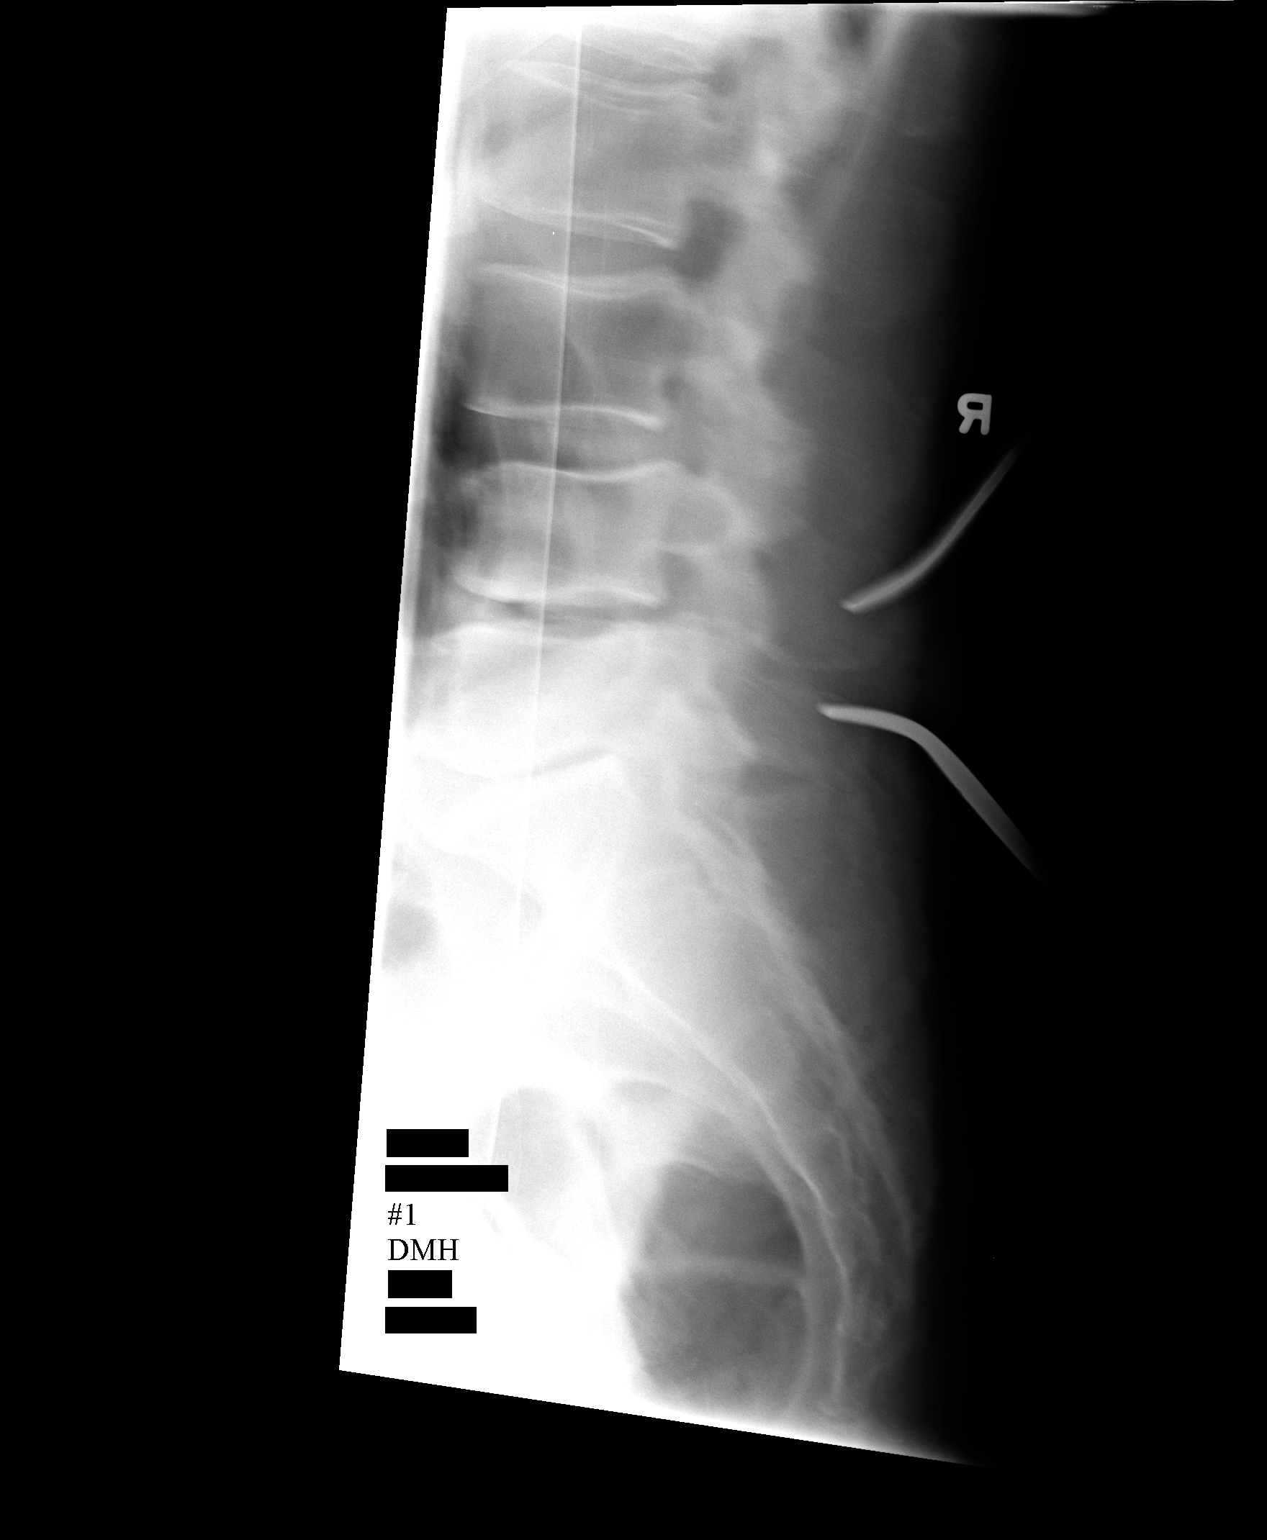

[1 of 1 positions shown; findings below may reference images not displayed]

FINDINGS: Lateral intraoperative view of the lumbar spine
demonstrates the bottom probe projecting over the top margin of the
L5 spinous process, and the top probe projecting over the mid
posterior portion of the L4 spinous process.
IMPRESSION: 1.  Probe localization of the L4 and L5 spinous processes.

## 2012-04-26 ENCOUNTER — Telehealth: Payer: Self-pay | Admitting: Cardiology

## 2012-04-26 MED ORDER — NITROGLYCERIN 0.4 MG SL SUBL: 0.4000 mg | SUBLINGUAL_TABLET | SUBLINGUAL | Status: DC | PRN

## 2012-04-26 NOTE — Telephone Encounter (Signed)
Needing Nitro refilled please send to [facility] in [place].

## 2012-04-26 NOTE — Addendum Note (Signed)
Addended by: Burnice Logan on: 04/26/2012 02:38 PM   Modules accepted: Orders

## 2012-06-08 ENCOUNTER — Ambulatory Visit: Payer: BC Managed Care – PPO | Admitting: Cardiology

## 2012-06-16 ENCOUNTER — Ambulatory Visit: Payer: BC Managed Care – PPO | Admitting: Physician Assistant

## 2012-06-24 ENCOUNTER — Ambulatory Visit: Payer: BC Managed Care – PPO | Admitting: Physician Assistant

## 2012-07-05 ENCOUNTER — Ambulatory Visit: Payer: BC Managed Care – PPO | Admitting: Physician Assistant

## 2012-07-28 ENCOUNTER — Ambulatory Visit (INDEPENDENT_AMBULATORY_CARE_PROVIDER_SITE_OTHER): Payer: BC Managed Care – PPO | Admitting: Physician Assistant

## 2012-07-28 ENCOUNTER — Encounter: Payer: Self-pay | Admitting: Physician Assistant

## 2012-07-28 VITALS — BP 160/78 | HR 75 | Ht 68.0 in | Wt 173.0 lb

## 2012-07-28 DIAGNOSIS — R0989 Other specified symptoms and signs involving the circulatory and respiratory systems: Secondary | ICD-10-CM

## 2012-07-28 DIAGNOSIS — I471 Supraventricular tachycardia: Secondary | ICD-10-CM

## 2012-07-28 DIAGNOSIS — I498 Other specified cardiac arrhythmias: Secondary | ICD-10-CM

## 2012-07-28 MED ORDER — ASPIRIN EC 81 MG PO TBEC: 81.0000 mg | DELAYED_RELEASE_TABLET | Freq: Every day | ORAL | Status: AC

## 2012-07-28 MED ORDER — DILTIAZEM HCL ER COATED BEADS 120 MG PO CP24: 120.0000 mg | ORAL_CAPSULE | Freq: Every day | ORAL | Status: DC

## 2012-07-28 MED ORDER — PRAVASTATIN SODIUM 20 MG PO TABS: 20.0000 mg | ORAL_TABLET | Freq: Every evening | ORAL | Status: DC

## 2012-07-28 NOTE — Assessment & Plan Note (Signed)
Status post bilateral CEA, with bilateral bruits on exam. Last study here at [facility] was in 2010, indicating 50-70% bilateral ICA stenosis. We'll reassess severity with a followup carotid duplex study.

## 2012-07-28 NOTE — Assessment & Plan Note (Signed)
We discussed the importance of smoking cessation; however, patient made it clear that he has no intention to do so.

## 2012-07-28 NOTE — Assessment & Plan Note (Signed)
Patient returns to office with no interim development of exertional CP. His last ischemic evaluation was a dobutamine stress echocardiogram in July 2012, reviewed by Dr. Diona Browner, which was consistent with underlying CAD and previous inferior infarct, albeit without active ischemia; EF greater than 65%. Recommended continued medical management. Will decrease ASA  from full dose to 81 mg daily. Patient did not tolerate beta blocker in the past, but has agreed to be placed back on Cardizem, which was recommended at time of last OV in place of Toprol. We will resume this at the previous recommended dose of 120 mg daily. This will help modulate both his HTN, as well as provide prophylaxis against recurrent AVNRT.

## 2012-07-28 NOTE — Assessment & Plan Note (Signed)
Patient is no longer on a statin. We will place him back on Pravachol 20 mg daily, which he had been on in the past, and reassess lipid status in 12 weeks.

## 2012-07-28 NOTE — Progress Notes (Signed)
Primary Cardiologist: Simona Huh, MD (new)   HPI: Patient returns to our office after a prolonged hiatus, last seen here in October 2012, by Dr. Andee Lineman.  He denies any interim development of exertional CP or dyspnea. He also denies any tachycardia palpitations. Shortly after being placed on Cardizem at time of last OV, the patient stopped this medication on his own, citing recurrent epistaxis.  Patient continues to smoke, approximately 1-2 ppd.   Twelve-lead EKG today, reviewed by me, indicates NSR 75 bpm; LAD; frequent PACs; no acute changes  Allergies  Allergen Reactions  . Codeine     REACTION: stomach upset  . Hydromorphone Hcl     REACTION: violent behavior  . Promethazine Hcl     REACTION: violent behaviors    Current Outpatient Prescriptions  Medication Sig Dispense Refill  . citalopram (CELEXA) 40 MG tablet Take 1 tablet (40 mg total) by mouth daily.      . nitroGLYCERIN (NITROSTAT) 0.4 MG SL tablet Place 1 tablet (0.4 mg total) under the tongue every 5 (five) minutes as needed for chest pain.  25 tablet  1  . aspirin EC 81 MG tablet Take 1 tablet (81 mg total) by mouth daily.      Marland Kitchen diltiazem (CARDIZEM CD) 120 MG 24 hr capsule Take 1 capsule (120 mg total) by mouth daily.  30 capsule  1  . pravastatin (PRAVACHOL) 20 MG tablet Take 1 tablet (20 mg total) by mouth every evening.  30 tablet  1   No current facility-administered medications for this visit.    Past Medical History  Diagnosis Date  . Hypertension     unspecified  . Hyperlipidemia, mixed   . CAD (coronary artery disease)     Native Vessel, Severe single-vessel coronary artery disease. a. status post aborted inferior myocardial infarction, followed by a bare-metal stenting of subtotal proximal RCA, January 2007. b. Residual nonobructive coronary artery disease. c. EF 50-55%; inferobasal akineses  . Cerebrovascular disease     a. Status post prior bilateral carotid endarterectomy, with residual bilateral  bruits. b. 50-69% right ICA stenosis, February 2009. c Bilateral, proximal ECA occlusion with distal collateralization  . Tobacco abuse   . Dyslipidemia     Past Surgical History  Procedure Laterality Date  . Carotid endarterectomy      Redo; right carotid endarterectomy with replacement of carotid artery with interpostion greater saphenous vein from the left leg    History   Social History  . Marital Status: Married    Spouse Name: N/A    Number of Children: N/A  . Years of Education: N/A   Occupational History  . Not on file.   Social History Main Topics  . Smoking status: Current Every Day Smoker -- 1.50 packs/day for 40 years  . Smokeless tobacco: Never Used  . Alcohol Use: No  . Drug Use: No  . Sexually Active: Not on file   Other Topics Concern  . Not on file   Social History Narrative  . No narrative on file    Family History  Problem Relation Age of Onset  . Coronary artery disease      ROS: no nausea, vomiting; no fever, chills; no melena, hematochezia; no claudication  PHYSICAL EXAM: BP 160/78  Pulse 75  Ht 5\' 8"  (1.727 m)  Wt 173 lb (78.472 kg)  BMI 26.31 kg/m2 GENERAL: 62 year-old male; NAD HEENT: NCAT, PERRLA, EOMI; sclera clear; no xanthelasma NECK: bilateral carotid bruits; no JVD; no TM LUNGS: CTA  bilaterally CARDIAC: RRR (S1, S2); no significant murmurs; no rubs or gallops ABDOMEN: soft, non-tender; intact BS EXTREMETIES: no significant peripheral edema SKIN: warm/dry; no obvious rash/lesions MUSCULOSKELETAL: no joint deformity NEURO: no focal deficit; NL affect   EKG: reviewed and available in Electronic Records   ASSESSMENT & PLAN:  CAD, NATIVE VESSEL Patient returns to office with no interim development of exertional CP. His last ischemic evaluation was a dobutamine stress echocardiogram in July 2012, reviewed by Dr. Diona Browner, which was consistent with underlying CAD and previous inferior infarct, albeit without active ischemia;  EF greater than 65%. Recommended continued medical management. Will decrease ASA  from full dose to 81 mg daily. Patient did not tolerate beta blocker in the past, but has agreed to be placed back on Cardizem, which was recommended at time of last OV in place of Toprol. We will resume this at the previous recommended dose of 120 mg daily. This will help modulate both his HTN, as well as provide prophylaxis against recurrent AVNRT.   TOBACCO ABUSE We discussed the importance of smoking cessation; however, patient made it clear that he has no intention to do so.  HYPERLIPIDEMIA-MIXED Patient is no longer on a statin. We will place him back on Pravachol 20 mg daily, which he had been on in the past, and reassess lipid status in 12 weeks.  HYPERTENSION, UNSPECIFIED Reassess at next OV, following today's addition of Cardizem.  CEREBROVASCULAR DISEASE Status post bilateral CEA, with bilateral bruits on exam. Last study here at [facility] was in 2010, indicating 50-70% bilateral ICA stenosis. We'll reassess severity with a followup carotid duplex study.    Gene Macie Baum, PAC

## 2012-07-28 NOTE — Assessment & Plan Note (Signed)
Reassess at next OV, following today's addition of Cardizem.

## 2012-07-28 NOTE — Patient Instructions (Addendum)
   Decrease Aspirin to 162mg  daily, then 81mg  daily thereafter   Cardizem CD 120mg  daily - 30 day + 1 refill sent to local pharm & 90 day + 3 refill sent to [facility]  Pravastatin 20mg  every evening - new sent to local pharm - call office it tolerate medication & can send for 90 day at that time   Carotid Dopplers  Office will contact with results Continue all other current medications.  Your physician wants you to follow up in: 6 months.  You will receive a reminder letter in the mail one-two months in advance.  If you don't receive a letter, please call our office to schedule the follow up appointment

## 2012-08-04 ENCOUNTER — Encounter (INDEPENDENT_AMBULATORY_CARE_PROVIDER_SITE_OTHER): Payer: BC Managed Care – PPO

## 2012-08-04 DIAGNOSIS — R0989 Other specified symptoms and signs involving the circulatory and respiratory systems: Secondary | ICD-10-CM

## 2012-08-04 DIAGNOSIS — I6529 Occlusion and stenosis of unspecified carotid artery: Secondary | ICD-10-CM

## 2012-08-10 ENCOUNTER — Telehealth: Payer: Self-pay | Admitting: *Deleted

## 2012-08-10 DIAGNOSIS — I6523 Occlusion and stenosis of bilateral carotid arteries: Secondary | ICD-10-CM

## 2012-08-10 NOTE — Telephone Encounter (Signed)
Message copied by Lesle Chris on Tue Aug 10, 2012 10:12 AM ------      Message from: Prescott Parma C      Created: Mon Aug 09, 2012 11:53 AM       60-79% RICA stenosis; 100% LICA occlusion. S/p bilateral CEA. Please refer to [facility] for further recommendations. ------

## 2012-08-10 NOTE — Telephone Encounter (Signed)
Notes Recorded by Lesle Chris, LPN on 0/12/6576 at 10:11 AM Patient notified and verbalized understanding.   Will forward order to [facility]) for scheduling.

## 2012-08-13 ENCOUNTER — Other Ambulatory Visit: Payer: Self-pay | Admitting: *Deleted

## 2012-08-17 ENCOUNTER — Ambulatory Visit (HOSPITAL_COMMUNITY)
Admission: RE | Admit: 2012-08-17 | Discharge: 2012-08-17 | Disposition: A | Discharge status: Home / Self Care | Payer: Self-pay | Source: Ambulatory Visit | Attending: Internal Medicine | Admitting: Internal Medicine

## 2012-08-17 ENCOUNTER — Other Ambulatory Visit (HOSPITAL_COMMUNITY): Payer: Self-pay | Admitting: Internal Medicine

## 2012-08-17 DIAGNOSIS — M4040 Postural lordosis, site unspecified: Secondary | ICD-10-CM | POA: Insufficient documentation

## 2012-08-17 DIAGNOSIS — M542 Cervicalgia: Secondary | ICD-10-CM

## 2012-08-17 DIAGNOSIS — M5030 Other cervical disc degeneration, unspecified cervical region: Secondary | ICD-10-CM | POA: Insufficient documentation

## 2012-09-07 ENCOUNTER — Encounter: Payer: Self-pay | Admitting: Vascular Surgery

## 2012-09-08 ENCOUNTER — Ambulatory Visit: Payer: BC Managed Care – PPO | Admitting: Vascular Surgery

## 2012-09-08 ENCOUNTER — Telehealth: Payer: Self-pay | Admitting: Vascular Surgery

## 2012-09-08 ENCOUNTER — Other Ambulatory Visit: Payer: BC Managed Care – PPO

## 2012-09-08 NOTE — Telephone Encounter (Signed)
Patient had an appointment today 09/08/12 for a carotid ultrasound and to see CSD. At check-in Olegario Messier noted to the patient that his [employer] stated he was NOT eligible. I subsequently checked Blue E as a courtesy to the patient. Blue E noted that his coverage termed on 07/02/12.   Mr Winker and his wife called [employer] while here in the office and found out that there was a check that did not get cashed that was to [facility]. Mr Testa wanted to reschedule until he could cancel his check and pay online to re-instate his insurance. I offered for him to keep his appointment but he felt more comfortable waiting until his insurance was straightened out.  He has been rescheduled for 09/15/12 and I have notified the referring office of the situation, dpm

## 2012-09-14 ENCOUNTER — Encounter: Payer: Self-pay | Admitting: Vascular Surgery

## 2012-09-15 ENCOUNTER — Ambulatory Visit (INDEPENDENT_AMBULATORY_CARE_PROVIDER_SITE_OTHER): Payer: BC Managed Care – PPO | Admitting: Vascular Surgery

## 2012-09-15 ENCOUNTER — Encounter: Payer: Self-pay | Admitting: Vascular Surgery

## 2012-09-15 ENCOUNTER — Other Ambulatory Visit (INDEPENDENT_AMBULATORY_CARE_PROVIDER_SITE_OTHER): Payer: BC Managed Care – PPO | Admitting: *Deleted

## 2012-09-15 DIAGNOSIS — I6529 Occlusion and stenosis of unspecified carotid artery: Secondary | ICD-10-CM | POA: Insufficient documentation

## 2012-09-15 NOTE — Progress Notes (Signed)
[facility] [place]  Patient name: Edward Brown MRN: [id number] DOB: 11/08/1950 Sex: male  REASON FOR VISIT: Follow up of carotid disease.   HPI: Edward Brown is a 62 y.o. male who underwent staged bilateral carotid endarterectomies in 2005. He had a right carotid endarterectomy with Dacron patch angioplasty on 09/28/2003. He subsequently had a left carotid endarterectomy with Dacron patch on 10/31/2003. He was admitted in 2010 with left eye pain and acute expressive aphasia. He was found to have a recurrent 80% right carotid stenosis with an occluded left carotid artery. He had a recurrent stenosis just above his patch on the right. He had a history of chronic left maxillary sinus obstruction it was felt to be at increased risk of infection therefore I elected to use vein patch for his redo right carotid endarterectomy. In reviewing his operative report for 2010, this was a fairly high dissection. It was noted that he had thick intimal plaque which could not be smoothly endarterectomized and this was very ulcerated. This reason I elected to replace the carotid artery with an interposition vein graft. Given the size mismatch I elected to use the vein in a non-reverse fashion and the valves were cut with a valvulotome.  He comes in for a follow up visit. He has been seen in the [place] office and duplex scan there on for 214 suggested a 60-79% right internal carotid artery stenosis with a known left internal carotid artery occlusion. Both vertebral arteries had antegrade flow. He tells me that he has been having intermittent visual disturbances in both eyes that resulted in transient loss of vision and are possibly consistent with amaurosis fugax. He denies any focal weakness or paresthesias. He denies any expressive or receptive aphasia. He is on aspirin. He is also on Pravachol.   Past Medical History  Diagnosis Date  . Hypertension     unspecified  . Hyperlipidemia, mixed   .  CAD (coronary artery disease)     Native Vessel, Severe single-vessel coronary artery disease. a. status post aborted inferior myocardial infarction, followed by a bare-metal stenting of subtotal proximal RCA, January 2007. b. Residual nonobructive coronary artery disease. c. EF 50-55%; inferobasal akineses  . Cerebrovascular disease     a. Status post prior bilateral carotid endarterectomy, with residual bilateral bruits. b. 50-69% right ICA stenosis, February 2009. c Bilateral, proximal ECA occlusion with distal collateralization  . Tobacco abuse   . Dyslipidemia   . Stroke 2010  . Myocardial infarction 2005   Family History  Problem Relation Age of Onset  . Coronary artery disease    . Dementia Mother   . Heart disease Father     Heart Disease before age 60  . Heart attack Father    SOCIAL HISTORY: History  Substance Use Topics  . Smoking status: Current Every Day Smoker -- 1.50 packs/day for 40 years  . Smokeless tobacco: Never Used  . Alcohol Use: No   He continues to smoke 1-1/2 packs per and has been smoking for 40 years per  Allergies  Allergen Reactions  . Codeine     REACTION: stomach upset  . Hydromorphone Hcl     REACTION: violent behavior  . Promethazine Hcl     REACTION: violent behaviors    Current Outpatient Prescriptions  Medication Sig Dispense Refill  . aspirin EC 81 MG tablet Take 1 tablet (81 mg total) by mouth daily.      . diltiazem (CARDIZEM CD) 120 MG 24   hr capsule Take 1 capsule (120 mg total) by mouth daily.  90 capsule  3  . nitroGLYCERIN (NITROSTAT) 0.4 MG SL tablet Place 1 tablet (0.4 mg total) under the tongue every 5 (five) minutes as needed for chest pain.  25 tablet  1  . pravastatin (PRAVACHOL) 20 MG tablet Take 1 tablet (20 mg total) by mouth every evening.  30 tablet  1  . citalopram (CELEXA) 40 MG tablet Take 1 tablet (40 mg total) by mouth daily.       No current facility-administered medications for this visit.   REVIEW OF SYSTEMS:  [X ] denotes positive finding; [  ] denotes negative finding  CARDIOVASCULAR:  [ ] chest pain   [ ] chest pressure   [ ] palpitations   [ ] orthopnea   [ ] dyspnea on exertion   [ ] claudication   [ ] rest pain   [ ] DVT   [ ] phlebitis PULMONARY:   [ ] productive cough   [ ] asthma   [ ] wheezing NEUROLOGIC:   [ ] weakness  [ ] paresthesias  [ ] aphasia  [X ] amaurosis  [ ] dizziness HEMATOLOGIC:   [ ] bleeding problems   [ ] clotting disorders MUSCULOSKELETAL:  [ ] joint pain   [ ] joint swelling [ ] leg swelling GASTROINTESTINAL: [ ]  blood in stool  [ ]  hematemesis GENITOURINARY:  [ ]  dysuria  [ ]  hematuria PSYCHIATRIC:  [ ] history of major depression INTEGUMENTARY:  [ ] rashes  [ ] ulcers CONSTITUTIONAL:  [ ] fever   [ ] chills  PHYSICAL EXAM: Filed Vitals:   09/15/12 1427 09/15/12 1431  BP: 126/69 120/63  Pulse: 75 57  Resp: 16   Height: 5' 8" (1.727 m)   Weight: 175 lb (79.379 kg)   SpO2: 94%    Body mass index is 26.61 kg/(m^2). GENERAL: The patient is a well-nourished male, in no acute distress. The vital signs are documented above. CARDIOVASCULAR: There is a regular rate and rhythm. He has bilateral carotid bruits per PULMONARY: There is good air exchange bilaterally without wheezing or rales. ABDOMEN: Soft and non-tender with normal pitched bowel sounds.  MUSCULOSKELETAL: There are no major deformities or cyanosis. NEUROLOGIC: No focal weakness or paresthesias are detected. SKIN: There are no ulcers or rashes noted. PSYCHIATRIC: The patient has a normal affect.  DATA:  I have independently interpreted the carotid duplex scan in our office which shows a 40-59% right internal carotid artery stenosis with a known left internal carotid artery occlusion. Vertebral arteries are patent with normally directed flow.  MEDICAL ISSUES:  Occlusion and stenosis of carotid artery without mention of cerebral infarction This patient has been having repeated episodes of transient  loss of vision which could potentially be related to his carotid disease. He has an approximately 60% right carotid stenosis and has undergone previous redo right carotid endarterectomy with interposition vein graft. I have recommended that we proceed with cerebral arteriography in order to further assess the right carotid stenosis. I have discussed the indications for cerebral arteriography. I've also discussed the potential complications of the procedure, including but not limited to: Bleeding, arterial injury, stroke (1%. Procedural risk), renal insufficiency, or other unpredictable medical problems. All the patient'Brown questions were answered and they are agreeable to proceed. If he is found to have significant ulceration or stenosis in the right carotid artery he may need to be considered for   carotid stenting given that the dissection was noted to be quite high from his previous operative. If the stenosis on the right is smooth and does not appear to be significant, he would likely benefit from ophthalmologic evaluation. In addition, I have discussed with him again the importance of tobacco cessation. He understands it continued tobacco use does significantly increase his risk progressive vascular disease.    Edward Brown [facility] [place] Beeper: [phone]    

## 2012-09-15 NOTE — Assessment & Plan Note (Signed)
This patient has been having repeated episodes of transient loss of vision which could potentially be related to his carotid disease. He has an approximately 60% right carotid stenosis and has undergone previous redo right carotid endarterectomy with interposition vein graft. I have recommended that we proceed with cerebral arteriography in order to further assess the right carotid stenosis. I have discussed the indications for cerebral arteriography. I've also discussed the potential complications of the procedure, including but not limited to: Bleeding, arterial injury, stroke (1%. Procedural risk), renal insufficiency, or other unpredictable medical problems. All the patient's questions were answered and they are agreeable to proceed. If he is found to have significant ulceration or stenosis in the right carotid artery he may need to be considered for carotid stenting given that the dissection was noted to be quite high from his previous operative. If the stenosis on the right is smooth and does not appear to be significant, he would likely benefit from ophthalmologic evaluation. In addition, I have discussed with him again the importance of tobacco cessation. He understands it continued tobacco use does significantly increase his risk progressive vascular disease.

## 2012-09-16 ENCOUNTER — Other Ambulatory Visit: Payer: Self-pay

## 2012-09-20 ENCOUNTER — Encounter (HOSPITAL_COMMUNITY)
Admission: RE | Disposition: A | Discharge status: Home / Self Care | Payer: Self-pay | Source: Ambulatory Visit | Attending: Vascular Surgery

## 2012-09-20 ENCOUNTER — Ambulatory Visit (HOSPITAL_COMMUNITY)
Admission: RE | Admit: 2012-09-20 | Discharge: 2012-09-20 | Disposition: A | Discharge status: Home / Self Care | Payer: BC Managed Care – PPO | Source: Ambulatory Visit | Attending: Vascular Surgery | Admitting: Vascular Surgery

## 2012-09-20 ENCOUNTER — Other Ambulatory Visit: Payer: Self-pay | Admitting: *Deleted

## 2012-09-20 DIAGNOSIS — Z8673 Personal history of transient ischemic attack (TIA), and cerebral infarction without residual deficits: Secondary | ICD-10-CM | POA: Insufficient documentation

## 2012-09-20 DIAGNOSIS — I6529 Occlusion and stenosis of unspecified carotid artery: Secondary | ICD-10-CM | POA: Insufficient documentation

## 2012-09-20 DIAGNOSIS — Z7982 Long term (current) use of aspirin: Secondary | ICD-10-CM | POA: Insufficient documentation

## 2012-09-20 DIAGNOSIS — I252 Old myocardial infarction: Secondary | ICD-10-CM | POA: Insufficient documentation

## 2012-09-20 DIAGNOSIS — I2510 Atherosclerotic heart disease of native coronary artery without angina pectoris: Secondary | ICD-10-CM | POA: Insufficient documentation

## 2012-09-20 DIAGNOSIS — Z79899 Other long term (current) drug therapy: Secondary | ICD-10-CM | POA: Insufficient documentation

## 2012-09-20 DIAGNOSIS — F17200 Nicotine dependence, unspecified, uncomplicated: Secondary | ICD-10-CM | POA: Insufficient documentation

## 2012-09-20 DIAGNOSIS — H53129 Transient visual loss, unspecified eye: Secondary | ICD-10-CM | POA: Insufficient documentation

## 2012-09-20 DIAGNOSIS — I658 Occlusion and stenosis of other precerebral arteries: Secondary | ICD-10-CM | POA: Insufficient documentation

## 2012-09-20 DIAGNOSIS — I10 Essential (primary) hypertension: Secondary | ICD-10-CM | POA: Insufficient documentation

## 2012-09-20 DIAGNOSIS — Z8249 Family history of ischemic heart disease and other diseases of the circulatory system: Secondary | ICD-10-CM | POA: Insufficient documentation

## 2012-09-20 DIAGNOSIS — E782 Mixed hyperlipidemia: Secondary | ICD-10-CM | POA: Insufficient documentation

## 2012-09-20 HISTORY — PX: ARCH AORTOGRAM: SHX5501

## 2012-09-20 LAB — POCT I-STAT, CHEM 8
BUN: 16 mg/dL (ref 6–23)
Calcium, Ion: 1.2 mmol/L (ref 1.13–1.30)
Chloride: 106 mEq/L (ref 96–112)
Potassium: 4.3 mEq/L (ref 3.5–5.1)

## 2012-09-20 SURGERY — ARCH AORTOGRAM
Anesthesia: LOCAL

## 2012-09-20 MED ORDER — SODIUM CHLORIDE 0.9 % IV SOLN: 1.0000 mL/kg/h | INTRAVENOUS | Status: DC

## 2012-09-20 MED ORDER — ONDANSETRON HCL 4 MG/2ML IJ SOLN: 4.0000 mg | Freq: Four times a day (QID) | INTRAMUSCULAR | Status: DC | PRN

## 2012-09-20 MED ORDER — LIDOCAINE HCL (PF) 1 % IJ SOLN
INTRAMUSCULAR | Status: AC
  Filled 2012-09-20: qty 30

## 2012-09-20 MED ORDER — HEPARIN (PORCINE) IN NACL 2-0.9 UNIT/ML-% IJ SOLN
INTRAMUSCULAR | Status: AC
  Filled 2012-09-20: qty 1000

## 2012-09-20 MED ORDER — SODIUM CHLORIDE 0.9 % IV SOLN: INTRAVENOUS | Status: DC

## 2012-09-20 MED ORDER — ACETAMINOPHEN 325 MG PO TABS: 650.0000 mg | ORAL_TABLET | ORAL | Status: DC | PRN

## 2012-09-20 NOTE — H&P (View-Only) (Signed)
[facility]ist of [place]  Patient name: Edward Brown MRN: [id number] DOB: 1951/02/09 Sex: male  REASON FOR VISIT: Follow up of carotid disease.   HPI: Edward Brown is a 62 y.o. male who underwent staged bilateral carotid endarterectomies in 2005. He had a right carotid endarterectomy with Dacron patch angioplasty on 09/28/2003. He subsequently had a left carotid endarterectomy with Dacron patch on 10/31/2003. He was admitted in 2010 with left eye pain and acute expressive aphasia. He was found to have a recurrent 80% right carotid stenosis with an occluded left carotid artery. He had a recurrent stenosis just above his patch on the right. He had a history of chronic left maxillary sinus obstruction it was felt to be at increased risk of infection therefore I elected to use vein patch for his redo right carotid endarterectomy. In reviewing his operative report for 2010, this was a fairly high dissection. It was noted that he had thick intimal plaque which could not be smoothly endarterectomized and this was very ulcerated. This reason I elected to replace the carotid artery with an interposition vein graft. Given the size mismatch I elected to use the vein in a non-reverse fashion and the valves were cut with a valvulotome.  He comes in for a follow up visit. He has been seen in the [place] office and duplex scan there on for 214 suggested a 60-79% right internal carotid artery stenosis with a known left internal carotid artery occlusion. Both vertebral arteries had antegrade flow. He tells me that he has been having intermittent visual disturbances in both eyes that resulted in transient loss of vision and are possibly consistent with amaurosis fugax. He denies any focal weakness or paresthesias. He denies any expressive or receptive aphasia. He is on aspirin. He is also on Pravachol.   Past Medical History  Diagnosis Date  . Hypertension     unspecified  . Hyperlipidemia, mixed   .  CAD (coronary artery disease)     Native Vessel, Severe single-vessel coronary artery disease. a. status post aborted inferior myocardial infarction, followed by a bare-metal stenting of subtotal proximal RCA, January 2007. b. Residual nonobructive coronary artery disease. c. EF 50-55%; inferobasal akineses  . Cerebrovascular disease     a. Status post prior bilateral carotid endarterectomy, with residual bilateral bruits. b. 50-69% right ICA stenosis, February 2009. c Bilateral, proximal ECA occlusion with distal collateralization  . Tobacco abuse   . Dyslipidemia   . Stroke 2010  . Myocardial infarction 2005   Family History  Problem Relation Age of Onset  . Coronary artery disease    . Dementia Mother   . Heart disease Father     Heart Disease before age 88  . Heart attack Father    SOCIAL HISTORY: History  Substance Use Topics  . Smoking status: Current Every Day Smoker -- 1.50 packs/day for 40 years  . Smokeless tobacco: Never Used  . Alcohol Use: No   He continues to smoke 1-1/2 packs per and has been smoking for 40 years per  Allergies  Allergen Reactions  . Codeine     REACTION: stomach upset  . Hydromorphone Hcl     REACTION: violent behavior  . Promethazine Hcl     REACTION: violent behaviors    Current Outpatient Prescriptions  Medication Sig Dispense Refill  . aspirin EC 81 MG tablet Take 1 tablet (81 mg total) by mouth daily.      Marland Kitchen diltiazem (CARDIZEM CD) 120 MG 24  hr capsule Take 1 capsule (120 mg total) by mouth daily.  90 capsule  3  . nitroGLYCERIN (NITROSTAT) 0.4 MG SL tablet Place 1 tablet (0.4 mg total) under the tongue every 5 (five) minutes as needed for chest pain.  25 tablet  1  . pravastatin (PRAVACHOL) 20 MG tablet Take 1 tablet (20 mg total) by mouth every evening.  30 tablet  1  . citalopram (CELEXA) 40 MG tablet Take 1 tablet (40 mg total) by mouth daily.       No current facility-administered medications for this visit.   REVIEW OF SYSTEMS:  Arly.Keller ] denotes positive finding; [  ] denotes negative finding  CARDIOVASCULAR:  [ ]  chest pain   [ ]  chest pressure   [ ]  palpitations   [ ]  orthopnea   [ ]  dyspnea on exertion   [ ]  claudication   [ ]  rest pain   [ ]  DVT   [ ]  phlebitis PULMONARY:   [ ]  productive cough   [ ]  asthma   [ ]  wheezing NEUROLOGIC:   [ ]  weakness  [ ]  paresthesias  [ ]  aphasia  Arly.Keller ] amaurosis  [ ]  dizziness HEMATOLOGIC:   [ ]  bleeding problems   [ ]  clotting disorders MUSCULOSKELETAL:  [ ]  joint pain   [ ]  joint swelling [ ]  leg swelling GASTROINTESTINAL: [ ]   blood in stool  [ ]   hematemesis GENITOURINARY:  [ ]   dysuria  [ ]   hematuria PSYCHIATRIC:  [ ]  history of major depression INTEGUMENTARY:  [ ]  rashes  [ ]  ulcers CONSTITUTIONAL:  [ ]  fever   [ ]  chills  PHYSICAL EXAM: Filed Vitals:   09/15/12 1427 09/15/12 1431  BP: 126/69 120/63  Pulse: 75 57  Resp: 16   Height: 5\' 8"  (1.727 m)   Weight: 175 lb (79.379 kg)   SpO2: 94%    Body mass index is 26.61 kg/(m^2). GENERAL: The patient is a well-nourished male, in no acute distress. The vital signs are documented above. CARDIOVASCULAR: There is a regular rate and rhythm. He has bilateral carotid bruits per PULMONARY: There is good air exchange bilaterally without wheezing or rales. ABDOMEN: Soft and non-tender with normal pitched bowel sounds.  MUSCULOSKELETAL: There are no major deformities or cyanosis. NEUROLOGIC: No focal weakness or paresthesias are detected. SKIN: There are no ulcers or rashes noted. PSYCHIATRIC: The patient has a normal affect.  DATA:  I have independently interpreted the carotid duplex scan in our office which shows a 40-59% right internal carotid artery stenosis with a known left internal carotid artery occlusion. Vertebral arteries are patent with normally directed flow.  MEDICAL ISSUES:  Occlusion and stenosis of carotid artery without mention of cerebral infarction This patient has been having repeated episodes of transient  loss of vision which could potentially be related to his carotid disease. He has an approximately 60% right carotid stenosis and has undergone previous redo right carotid endarterectomy with interposition vein graft. I have recommended that we proceed with cerebral arteriography in order to further assess the right carotid stenosis. I have discussed the indications for cerebral arteriography. I've also discussed the potential complications of the procedure, including but not limited to: Bleeding, arterial injury, stroke (1%. Procedural risk), renal insufficiency, or other unpredictable medical problems. All the patient's questions were answered and they are agreeable to proceed. If he is found to have significant ulceration or stenosis in the right carotid artery he may need to be considered for  carotid stenting given that the dissection was noted to be quite high from his previous operative. If the stenosis on the right is smooth and does not appear to be significant, he would likely benefit from ophthalmologic evaluation. In addition, I have discussed with him again the importance of tobacco cessation. He understands it continued tobacco use does significantly increase his risk progressive vascular disease.    Hiilei Gerst S [facility] [place] Beeper: [phone]

## 2012-09-20 NOTE — Op Note (Signed)
PATIENT: Edward Brown   MRN: [id number] DOB: [birth year]    DATE OF PROCEDURE: 09/20/2012  INDICATIONS: Edward Brown is a 62 y.o. male to his been having intermittent visual disturbances of both eyes. He has a known left carotid occlusion and has had previous redo right carotid endarterectomy with interposition vein graft. Duplex suggested a 60% stenosis in the right carotid and he is brought in for diagnostic arteriography to further assess this.  PROCEDURE:  1. Ultrasound guided access to the right common femoral artery 2. Arch aortogram 3. Selective catheterization of the right common carotid artery with right carotid arteriogram.  SURGEON: [name], MD, FACS  ANESTHESIA: local   EBL: minimal  TECHNIQUE: The patient was brought to the peripheral vascular lab. Both groins were prepped and draped in the usual sterile fashion. Under ultrasound guidance, after the skin was anesthetized, the right common femoral artery was cannulated and a guidewire introduced into the infrarenal aorta. A 5 French sheath was introduced over the wire. A pigtail catheter was positioned in the ascending aortic arch and an arch aortogram was obtained at a 40 LAO projection. This catheter was exchanged for an H1 catheter which was positioned into the innominate artery. A wire was advanced into the common carotid artery and then the catheter passed over the wire. Selective right common carotid arteriogram was obtained. Both intracranial and extracranial views were obtained. The intracranial views will be interpreted separately by the neuroradiologist.  FINDINGS:  1. No significant disease of the aortic arch. 2. The left common carotid artery is occluded proximally. 3. Both subclavian arteries and vertebral arteries are patent with no significant stenosis. 4. The common carotid and internal carotid artery are widely patent on the right. The interposition vein graft from redo right carotid endarterectomy is  widely patent with no stenosis identified. There is no stenosis at either anastomosis nor is there any stenosis within the vein graft itself.  Waverly Ferrari, MD, FACS [facility]  DATE OF DICTATION:   09/20/2012

## 2012-09-20 NOTE — Interval H&P Note (Signed)
History and Physical Interval Note:  09/20/2012 7:23 AM  Edward Brown  has presented today for surgery, with the diagnosis of carotid stenosis  The various methods of treatment have been discussed with the patient and family. After consideration of risks, benefits and other options for treatment, the patient has consented to  Procedure(s): CEREBRAL ANGIOGRAM (N/A) ARCH AORTOGRAM (N/A) as a surgical intervention .  The patient's history has been reviewed, patient examined, no change in status, stable for surgery.  I have reviewed the patient's chart and labs.  Questions were answered to the patient's satisfaction.     Kiira Brach S

## 2012-09-24 NOTE — Consult Note (Signed)
NAME:  Edward Brown, Edward Brown                  ACCOUNT NO.:  [id number]  MEDICAL RECORD NO.:  [id number]  LOCATION:                                 FACILITY:  PHYSICIAN:  Michai Dieppa K. Leda Bellefeuille, M.D.DATE OF BIRTH:  [birth year]  DATE OF CONSULTATION: DATE OF DISCHARGE:                                CONSULTATION   CLINICAL HISTORY:  Occluded left common carotid artery.  EXAMINATION:  Intracranial interpretation of right common carotid arteriogram.  The right common carotid arteriogram demonstrates the right internal carotid artery at the cervical petrous, the petrous cavernous, and the cavernous supraclinoid segments  To be  widely patent.  The right middle and right anterior cerebral arteries opacify   normally  Into  capillary and venous phases.  There was prompt simultaneous opacification via the anterior communicating artery of the left anterior and left middle cerebral artery distributions.  No antegrade flow  is seen in the region of the left internal carotid artery terminus secondary to the occluded left common carotid artery proximally.  Also there is no visualization of the right external carotid artery branches .  IMPRESSION: 1. Wide patency of the right internal carotid artery at the skull base     to the supraclinoid segment and subsequently the anterior     circulations bilaterally from the right internal carotid artery      as described above. 2. Nonvisualization of the left  external carotid artery branches     consistent with the occlusion of the left common  carotid artery.          ______________________________ Grandville Silos Corliss Skains, M.D.     SKD/MEDQ  D:  09/22/2012  T:  09/22/2012  Job:  [id number]

## 2012-11-04 ENCOUNTER — Telehealth: Payer: Self-pay | Admitting: *Deleted

## 2012-11-04 ENCOUNTER — Encounter: Payer: Self-pay | Admitting: *Deleted

## 2012-11-04 DIAGNOSIS — I2510 Atherosclerotic heart disease of native coronary artery without angina pectoris: Secondary | ICD-10-CM

## 2012-11-04 DIAGNOSIS — E782 Mixed hyperlipidemia: Secondary | ICD-10-CM

## 2012-11-04 DIAGNOSIS — Z79899 Other long term (current) drug therapy: Secondary | ICD-10-CM

## 2012-11-04 NOTE — Telephone Encounter (Signed)
Message copied by Lesle Chris on Thu Nov 04, 2012  1:57 PM ------      Message from: Lesle Chris      Created: Wed Jul 28, 2012  2:41 PM       FLP, LFT  ------

## 2012-11-04 NOTE — Telephone Encounter (Signed)
Letter & orders mailed today.  

## 2013-03-23 ENCOUNTER — Other Ambulatory Visit (HOSPITAL_COMMUNITY): Payer: BC Managed Care – PPO

## 2013-03-23 ENCOUNTER — Ambulatory Visit: Payer: BC Managed Care – PPO | Admitting: Vascular Surgery

## 2013-03-30 ENCOUNTER — Ambulatory Visit: Payer: BC Managed Care – PPO | Admitting: Vascular Surgery

## 2013-03-30 ENCOUNTER — Other Ambulatory Visit: Payer: BC Managed Care – PPO

## 2013-04-27 ENCOUNTER — Other Ambulatory Visit (HOSPITAL_COMMUNITY): Payer: BC Managed Care – PPO

## 2013-04-27 ENCOUNTER — Ambulatory Visit: Payer: BC Managed Care – PPO | Admitting: Vascular Surgery

## 2013-04-29 ENCOUNTER — Telehealth: Payer: Self-pay

## 2013-04-29 NOTE — Telephone Encounter (Signed)
Left message for patient to call back for an appointment.  Patient due to come back to office in 02/2013.

## 2013-05-17 ENCOUNTER — Encounter: Payer: Self-pay | Admitting: Vascular Surgery

## 2013-05-18 ENCOUNTER — Other Ambulatory Visit (HOSPITAL_COMMUNITY): Payer: BC Managed Care – PPO

## 2013-05-18 ENCOUNTER — Ambulatory Visit: Payer: BC Managed Care – PPO | Admitting: Vascular Surgery

## 2013-06-14 ENCOUNTER — Encounter: Payer: Self-pay | Admitting: Vascular Surgery

## 2013-06-15 ENCOUNTER — Ambulatory Visit: Payer: BC Managed Care – PPO | Admitting: Vascular Surgery

## 2013-06-15 ENCOUNTER — Inpatient Hospital Stay (HOSPITAL_COMMUNITY): Admission: RE | Admit: 2013-06-15 | Payer: BC Managed Care – PPO | Source: Ambulatory Visit

## 2013-06-15 ENCOUNTER — Other Ambulatory Visit (HOSPITAL_COMMUNITY): Payer: BC Managed Care – PPO

## 2014-01-16 DIAGNOSIS — F438 Other reactions to severe stress: Secondary | ICD-10-CM | POA: Diagnosis not present

## 2014-01-16 DIAGNOSIS — M65849 Other synovitis and tenosynovitis, unspecified hand: Secondary | ICD-10-CM | POA: Diagnosis not present

## 2014-01-16 DIAGNOSIS — Z6826 Body mass index (BMI) 26.0-26.9, adult: Secondary | ICD-10-CM | POA: Diagnosis not present

## 2014-01-16 DIAGNOSIS — F4389 Other reactions to severe stress: Secondary | ICD-10-CM | POA: Diagnosis not present

## 2014-01-16 DIAGNOSIS — M65839 Other synovitis and tenosynovitis, unspecified forearm: Secondary | ICD-10-CM | POA: Diagnosis not present

## 2014-04-13 ENCOUNTER — Encounter (HOSPITAL_COMMUNITY): Payer: Self-pay | Admitting: Vascular Surgery

## 2014-06-20 ENCOUNTER — Telehealth: Payer: Self-pay | Admitting: Cardiology

## 2014-06-20 NOTE — Telephone Encounter (Signed)
User: Time: StatusChanda Busing [[id number]] 07/28/2012 2:41 PM New [10]   [System] 10/03/2012 11:00 PM Notification Sent [20]   Chanda Busing [[id number]] 02/17/2013 6:19 PM Notification Sent [20]   Lynnda Child Slaughter [[id number]] 05/31/2013 3:38 PM Notification Sent [20]   Weston Anna [[id number]] 07/07/2013 2:48 PM Notification Sent [20]   Lynnda Child Slaughter [[id number]] 08/30/2013 4:38 PM Notification Sent [20]   Lynnda Child Slaughter [[id number]] 10/13/2013 9:58 AM Notification Sent [20]   Chanda Busing [[id number]] 12/07/2013 1:51 PM Notification Sent [20]   Weston Anna [[id number]] 05/02/2014 8:02 AM Notification Sent [20]   Chanda Busing [[id number]] 05/18/2014 9:53 AM Notification Sent [20]   Scheduling Instructions  6 month fu   Appointment Notes  6 month fu   Communication History  User: Delfino Lovett T Date/time: 05/18/14 9:53 AM  Comment:   Context: Manually Printed Recall    Phone number:  Phone Type:   Comm. type: Mail Call type:   Contact: Rauen,Dyshawn S Relation to patient: Self    User: Cher Nakai R Date/time: 05/02/14 8:02 AM  Comment:   Context: Manually Printed Recall    Phone number:  Phone Type:   Comm. type: Mail Call type:   Contact: Gilmartin,Ezequiel S Relation to patient: Self    User: Delfino Lovett T Date/time: 12/07/13 1:51 PM  Comment:   Context: Manually Printed Recall    Phone number:  Phone Type:   Comm. type: Mail Call type:   Contact: Keil,Rishikesh S Relation to patient: Self    User: Delfino Lovett T Date/time: 10/13/13 9:58 AM  Comment:   Context: Manually Printed Recall    Phone number:  Phone Type:   Comm. type: Mail Call type:   Contact: Monical,Marley S Relation to patient: Self    User: Delfino Lovett T Date/time: 08/30/13 4:38 PM  Comment:   Context: Manually Printed Recall    Phone number:  Phone Type:   Comm. type: Mail Call type:   Contact: Vetter,Yoel S Relation to patient: Self     User: Cher Nakai R Date/time: 07/07/13 2:48 PM  Comment:   Context: Manually Printed Recall    Phone number:  Phone Type:   Comm. type: Mail Call type:   Contact: Hortman,Pharell S Relation to patient: Self    User: Cher Nakai R Date/time: 07/07/13 2:48 PM  Comment: 07/07/13 - Left message and mailed another reminder-srs  Context: [facility] Recall Report    Phone number:  Phone Type:   Comm. type: Telephone Call type: Outgoing  Contact: phone & letter Relation to patient:

## 2015-02-06 DIAGNOSIS — I119 Hypertensive heart disease without heart failure: Secondary | ICD-10-CM | POA: Diagnosis not present

## 2015-02-06 DIAGNOSIS — I499 Cardiac arrhythmia, unspecified: Secondary | ICD-10-CM | POA: Diagnosis not present

## 2015-02-06 DIAGNOSIS — I200 Unstable angina: Secondary | ICD-10-CM | POA: Diagnosis not present

## 2015-02-06 DIAGNOSIS — I2510 Atherosclerotic heart disease of native coronary artery without angina pectoris: Secondary | ICD-10-CM | POA: Diagnosis not present

## 2015-02-06 DIAGNOSIS — Z955 Presence of coronary angioplasty implant and graft: Secondary | ICD-10-CM | POA: Diagnosis not present

## 2015-02-06 DIAGNOSIS — R0789 Other chest pain: Secondary | ICD-10-CM | POA: Diagnosis not present

## 2015-02-06 DIAGNOSIS — R739 Hyperglycemia, unspecified: Secondary | ICD-10-CM | POA: Diagnosis not present

## 2015-02-06 DIAGNOSIS — R079 Chest pain, unspecified: Secondary | ICD-10-CM | POA: Diagnosis not present

## 2015-02-06 DIAGNOSIS — I208 Other forms of angina pectoris: Secondary | ICD-10-CM | POA: Diagnosis not present

## 2015-02-06 DIAGNOSIS — I252 Old myocardial infarction: Secondary | ICD-10-CM | POA: Diagnosis not present

## 2015-02-07 DIAGNOSIS — I252 Old myocardial infarction: Secondary | ICD-10-CM | POA: Diagnosis not present

## 2015-02-07 DIAGNOSIS — I2510 Atherosclerotic heart disease of native coronary artery without angina pectoris: Secondary | ICD-10-CM | POA: Diagnosis not present

## 2015-02-07 DIAGNOSIS — I200 Unstable angina: Secondary | ICD-10-CM | POA: Diagnosis not present

## 2015-02-07 DIAGNOSIS — E785 Hyperlipidemia, unspecified: Secondary | ICD-10-CM | POA: Diagnosis not present

## 2015-02-07 DIAGNOSIS — Z951 Presence of aortocoronary bypass graft: Secondary | ICD-10-CM | POA: Diagnosis not present

## 2015-02-07 DIAGNOSIS — R079 Chest pain, unspecified: Secondary | ICD-10-CM | POA: Diagnosis not present

## 2015-02-07 DIAGNOSIS — R739 Hyperglycemia, unspecified: Secondary | ICD-10-CM | POA: Diagnosis not present

## 2015-02-07 DIAGNOSIS — Z955 Presence of coronary angioplasty implant and graft: Secondary | ICD-10-CM | POA: Diagnosis not present

## 2015-02-07 DIAGNOSIS — I119 Hypertensive heart disease without heart failure: Secondary | ICD-10-CM | POA: Diagnosis not present

## 2015-02-07 DIAGNOSIS — F17200 Nicotine dependence, unspecified, uncomplicated: Secondary | ICD-10-CM | POA: Diagnosis not present

## 2015-02-07 DIAGNOSIS — Z8673 Personal history of transient ischemic attack (TIA), and cerebral infarction without residual deficits: Secondary | ICD-10-CM | POA: Diagnosis not present

## 2015-02-14 ENCOUNTER — Encounter: Payer: Self-pay | Admitting: *Deleted

## 2015-02-15 DIAGNOSIS — Z1389 Encounter for screening for other disorder: Secondary | ICD-10-CM | POA: Diagnosis not present

## 2015-02-15 DIAGNOSIS — E663 Overweight: Secondary | ICD-10-CM | POA: Diagnosis not present

## 2015-02-15 DIAGNOSIS — I2510 Atherosclerotic heart disease of native coronary artery without angina pectoris: Secondary | ICD-10-CM | POA: Diagnosis not present

## 2015-02-15 DIAGNOSIS — F329 Major depressive disorder, single episode, unspecified: Secondary | ICD-10-CM | POA: Diagnosis not present

## 2015-02-15 DIAGNOSIS — Z6826 Body mass index (BMI) 26.0-26.9, adult: Secondary | ICD-10-CM | POA: Diagnosis not present

## 2015-02-16 ENCOUNTER — Ambulatory Visit (INDEPENDENT_AMBULATORY_CARE_PROVIDER_SITE_OTHER): Payer: Commercial Managed Care - HMO | Admitting: Cardiovascular Disease

## 2015-02-16 ENCOUNTER — Encounter: Payer: Self-pay | Admitting: *Deleted

## 2015-02-16 ENCOUNTER — Other Ambulatory Visit: Payer: Self-pay | Admitting: *Deleted

## 2015-02-16 ENCOUNTER — Encounter: Payer: Self-pay | Admitting: Cardiovascular Disease

## 2015-02-16 VITALS — BP 158/88 | HR 53 | Ht 68.0 in | Wt 177.0 lb

## 2015-02-16 DIAGNOSIS — R079 Chest pain, unspecified: Secondary | ICD-10-CM | POA: Diagnosis not present

## 2015-02-16 DIAGNOSIS — I779 Disorder of arteries and arterioles, unspecified: Secondary | ICD-10-CM | POA: Diagnosis not present

## 2015-02-16 DIAGNOSIS — Z720 Tobacco use: Secondary | ICD-10-CM

## 2015-02-16 DIAGNOSIS — I739 Peripheral vascular disease, unspecified: Secondary | ICD-10-CM

## 2015-02-16 DIAGNOSIS — R0989 Other specified symptoms and signs involving the circulatory and respiratory systems: Secondary | ICD-10-CM | POA: Diagnosis not present

## 2015-02-16 DIAGNOSIS — Z87898 Personal history of other specified conditions: Secondary | ICD-10-CM

## 2015-02-16 DIAGNOSIS — I10 Essential (primary) hypertension: Secondary | ICD-10-CM

## 2015-02-16 DIAGNOSIS — I25118 Atherosclerotic heart disease of native coronary artery with other forms of angina pectoris: Secondary | ICD-10-CM | POA: Diagnosis not present

## 2015-02-16 DIAGNOSIS — E785 Hyperlipidemia, unspecified: Secondary | ICD-10-CM

## 2015-02-16 DIAGNOSIS — Z9289 Personal history of other medical treatment: Secondary | ICD-10-CM

## 2015-02-16 MED ORDER — LISINOPRIL 5 MG PO TABS: 5.0000 mg | ORAL_TABLET | Freq: Every day | ORAL | Status: DC

## 2015-02-16 MED ORDER — NITROGLYCERIN 0.4 MG SL SUBL: 0.4000 mg | SUBLINGUAL_TABLET | SUBLINGUAL | Status: DC | PRN

## 2015-02-16 MED ORDER — ISOSORBIDE MONONITRATE ER 30 MG PO TB24: 30.0000 mg | ORAL_TABLET | Freq: Every day | ORAL | Status: DC

## 2015-02-16 MED ORDER — METOPROLOL SUCCINATE ER 25 MG PO TB24: 25.0000 mg | ORAL_TABLET | Freq: Every day | ORAL | Status: DC

## 2015-02-16 MED ORDER — ATORVASTATIN CALCIUM 40 MG PO TABS: 40.0000 mg | ORAL_TABLET | Freq: Every day | ORAL | Status: DC

## 2015-02-16 NOTE — Progress Notes (Signed)
Patient ID: Edward Brown, male   DOB: 06/11/1950, 64 y.o.   MRN: [id number]      SUBJECTIVE: The patient is a 64 year old male who I am meeting for the first time. He has a history of coronary artery disease with prior inferior myocardial infarction and RCA stenting. He was last seen in our office in March 2014. He also has a history of tobacco abuse, dyslipidemia, hypertension, and bilateral carotid artery disease with endarterectomies.  He was hospitalized for unstable angina at [facility] earlier this month. He has reportedly been noncompliant with his medications. Coronary angiography was considered but never pursued. I reviewed all relevant labs, studies, and documentation pertaining to this hospitalization. BUN 13, creatinine 0.8 on 02/06/15. Troponin less than 0.01. Hemoglobin 15.8, platelets 193.  He is currently on aspirin 81 mg, Lipitor 40 mg, Toprol-XL 25 mg, and Imdur 30 mg.  He said he has been weaker in the past 2 years and has attributed it to old age. He said he cannot do as much this year as he was able to do last year. He has exertional chest pain "once in a while " possibly twice a year. He denies shortness of breath. He experiences generalized weakness. He has some leg numbness while sleeping but denies claudication pain. Also denies syncope. He sometimes feels the sensation of his heart forcefully pounding. He denies orthopnea and leg swelling. He has a prior history of back surgery. I have no ECG available from [place]: Married to Spi[place].  Review of Systems: As per "subjective", otherwise negative.  Allergies  Allergen Reactions  . Codeine     REACTION: stomach upset  . Hydromorphone Hcl     REACTION: violent behavior  . Promethazine Hcl     REACTION: violent behaviors    Current Outpatient Prescriptions  Medication Sig Dispense Refill  . aspirin EC 81 MG tablet Take 1 tablet (81 mg total) by mouth daily.    Marland Kitchen atorvastatin (LIPITOR) 40 MG tablet Take  40 mg by mouth daily.    . citalopram (CELEXA) 40 MG tablet Take 1 tablet (40 mg total) by mouth daily.    . isosorbide mononitrate (IMDUR) 30 MG 24 hr tablet Take 30 mg by mouth daily.    . metoprolol succinate (TOPROL-XL) 25 MG 24 hr tablet Take 25 mg by mouth daily.    . nitroGLYCERIN (NITROSTAT) 0.4 MG SL tablet Place 1 tablet (0.4 mg total) under the tongue every 5 (five) minutes as needed for chest pain. 25 tablet 1   No current facility-administered medications for this visit.    Past Medical History  Diagnosis Date  . Hypertension     unspecified  . Hyperlipidemia, mixed   . CAD (coronary artery disease)     Native Vessel, Severe single-vessel coronary artery disease. a. status post aborted inferior myocardial infarction, followed by a bare-metal stenting of subtotal proximal RCA, January 2007. b. Residual nonobructive coronary artery disease. c. EF 50-55%; inferobasal akineses  . Cerebrovascular disease     a. Status post prior bilateral carotid endarterectomy, with residual bilateral bruits. b. 50-69% right ICA stenosis, February 2009. c Bilateral, proximal ECA occlusion with distal collateralization  . Tobacco abuse   . Dyslipidemia   . Stroke ([place]) 2010  . Myocardial infarction [employer]) 2005    Past Surgical History  Procedure Laterality Date  . Carotid endarterectomy      Redo; right carotid endarterectomy with replacement of carotid artery with interpostion greater saphenous vein from the  left leg  . Spine surgery  Aug. 2012  . Arch aortogram N/A 09/20/2012    Procedure: ARCH AORTOGRAM;  Surgeon: Angelia Mould, MD;  Location: [facility] CATH LAB;  Service: Cardiovascular;  Laterality: N/A;    Social History   Social History  . Marital Status: Married    Spouse Name: N/A  . Number of Children: N/A  . Years of Education: N/A   Occupational History  . Not on file.   Social History Main Topics  . Smoking status: Current Every Day Smoker -- 1.50 packs/day for 40  years    Types: Cigarettes  . Smokeless tobacco: Never Used  . Alcohol Use: No  . Drug Use: No  . Sexual Activity: Not on file   Other Topics Concern  . Not on file   Social History Narrative     Filed Vitals:   02/16/15 1431  BP: 158/88  Pulse: 53  Height: 5\' 8"  (1.727 m)  Weight: 177 lb (80.287 kg)  SpO2: 98%    PHYSICAL EXAM General: NAD HEENT: Normal. Neck: No JVD, no thyromegaly. Lungs: Clear to auscultation bilaterally with normal respiratory effort. CV: Nondisplaced PMI.  Regular rate and rhythm, normal S1/S2, no S3/S4, no murmur. No pretibial or periankle edema. Bilateral carotid bruits.   Abdomen: Soft, nontender, no distention.  Neurologic: Alert and oriented x 3.  Psych: Normal affect. Skin: Normal. Musculoskeletal: Normal range of motion, no gross deformities. Extremities: No clubbing or cyanosis.   ECG: Most recent ECG reviewed.      ASSESSMENT AND PLAN: 1. Fatigue and chest pain in context of CAD with h/o inferior MI and RCA stent: Currently on good medical therapy with aspirin, Lipitor, Imdur, and metoprolol. Will add ACEI given history of MI and presently elevated BP. Will obtain a Lexiscan Cardiolite stress test to evaluate for ischemia, as well as an echocardiogram to evaluate cardiac structure and function.  2. Essential HTN: Elevated. Given h/o MI, will start lisinopril 5 mg daily.  3. Carotid artery disease: Bilateral bruits appreciated. Will obtain Dopplers.  4. Dyslipidemia: On Lipitor. Will repeat lipids at next visit.  5. Tobacco abuse: Counseled to quit.  Dispo: f/u 1 month.  Time spent: 40 minutes, of which greater than 50% was spent reviewing symptoms, relevant blood tests and studies, and discussing management plan with the patient.   Kate Sable, M.D., F.A.C.C.

## 2015-02-16 NOTE — Patient Instructions (Signed)
Your physician has recommended you make the following change in your medication:  Start lisinopril 5 mg daily. Continue all other medications the same. Your physician has requested that you have an echocardiogram. Echocardiography is a painless test that uses sound waves to create images of your heart. It provides your doctor with information about the size and shape of your heart and how well your heart's chambers and valves are working. This procedure takes approximately one hour. There are no restrictions for this procedure. Your physician has requested that you have a carotid duplex. This test is an ultrasound of the carotid arteries in your neck. It looks at blood flow through these arteries that supply the brain with blood. Allow one hour for this exam. There are no restrictions or special instructions. Your physician has requested that you have a lexiscan myoview. For further information please visit [email or web address]. Please follow instruction sheet, as given. Your physician recommends that you schedule a follow-up appointment in: 1 month.

## 2015-02-16 NOTE — Addendum Note (Signed)
Addended by: Merlene Laughter on: 02/16/2015 03:56 PM   Modules accepted: Orders

## 2015-02-19 ENCOUNTER — Telehealth: Payer: Self-pay | Admitting: Cardiovascular Disease

## 2015-02-19 ENCOUNTER — Other Ambulatory Visit: Payer: Self-pay | Admitting: Cardiovascular Disease

## 2015-02-19 NOTE — Addendum Note (Signed)
Addended by: Merlene Laughter on: 02/19/2015 11:06 AM   Modules accepted: Orders

## 2015-02-19 NOTE — Telephone Encounter (Signed)
Patient is scheduled for Lexiscan, Echo, cartoid dopplers on 02-23-15 at [facility]

## 2015-02-23 ENCOUNTER — Ambulatory Visit (HOSPITAL_COMMUNITY)
Admission: RE | Admit: 2015-02-23 | Discharge: 2015-02-23 | Disposition: A | Discharge status: Home / Self Care | Payer: Commercial Managed Care - HMO | Source: Ambulatory Visit | Attending: Cardiovascular Disease | Admitting: Cardiovascular Disease

## 2015-02-23 ENCOUNTER — Encounter (HOSPITAL_COMMUNITY): Payer: Self-pay

## 2015-02-23 ENCOUNTER — Inpatient Hospital Stay (HOSPITAL_COMMUNITY): Admission: RE | Admit: 2015-02-23 | Payer: Commercial Managed Care - HMO | Source: Ambulatory Visit

## 2015-02-23 ENCOUNTER — Encounter (HOSPITAL_COMMUNITY)
Admission: RE | Admit: 2015-02-23 | Discharge: 2015-02-23 | Disposition: A | Discharge status: Home / Self Care | Payer: Commercial Managed Care - HMO | Source: Ambulatory Visit | Attending: Cardiovascular Disease | Admitting: Cardiovascular Disease

## 2015-02-23 ENCOUNTER — Encounter (HOSPITAL_BASED_OUTPATIENT_CLINIC_OR_DEPARTMENT_OTHER)
Admission: RE | Admit: 2015-02-23 | Discharge: 2015-02-23 | Disposition: A | Discharge status: Home / Self Care | Payer: Commercial Managed Care - HMO | Source: Ambulatory Visit | Attending: Cardiovascular Disease | Admitting: Cardiovascular Disease

## 2015-02-23 DIAGNOSIS — I6523 Occlusion and stenosis of bilateral carotid arteries: Secondary | ICD-10-CM | POA: Diagnosis not present

## 2015-02-23 DIAGNOSIS — F4310 Post-traumatic stress disorder, unspecified: Secondary | ICD-10-CM | POA: Diagnosis not present

## 2015-02-23 DIAGNOSIS — I6522 Occlusion and stenosis of left carotid artery: Secondary | ICD-10-CM | POA: Insufficient documentation

## 2015-02-23 DIAGNOSIS — I371 Nonrheumatic pulmonary valve insufficiency: Secondary | ICD-10-CM | POA: Diagnosis not present

## 2015-02-23 DIAGNOSIS — I6521 Occlusion and stenosis of right carotid artery: Secondary | ICD-10-CM | POA: Insufficient documentation

## 2015-02-23 DIAGNOSIS — I25118 Atherosclerotic heart disease of native coronary artery with other forms of angina pectoris: Secondary | ICD-10-CM

## 2015-02-23 DIAGNOSIS — I10 Essential (primary) hypertension: Secondary | ICD-10-CM | POA: Insufficient documentation

## 2015-02-23 DIAGNOSIS — I739 Peripheral vascular disease, unspecified: Principal | ICD-10-CM

## 2015-02-23 DIAGNOSIS — Z720 Tobacco use: Secondary | ICD-10-CM | POA: Insufficient documentation

## 2015-02-23 DIAGNOSIS — E785 Hyperlipidemia, unspecified: Secondary | ICD-10-CM | POA: Diagnosis not present

## 2015-02-23 DIAGNOSIS — I2510 Atherosclerotic heart disease of native coronary artery without angina pectoris: Secondary | ICD-10-CM | POA: Insufficient documentation

## 2015-02-23 DIAGNOSIS — R079 Chest pain, unspecified: Secondary | ICD-10-CM | POA: Diagnosis not present

## 2015-02-23 DIAGNOSIS — I081 Rheumatic disorders of both mitral and tricuspid valves: Secondary | ICD-10-CM | POA: Diagnosis not present

## 2015-02-23 DIAGNOSIS — I779 Disorder of arteries and arterioles, unspecified: Secondary | ICD-10-CM

## 2015-02-23 DIAGNOSIS — I679 Cerebrovascular disease, unspecified: Secondary | ICD-10-CM | POA: Insufficient documentation

## 2015-02-23 LAB — NM MYOCAR MULTI W/SPECT W/WALL MOTION / EF
CHL CUP NUCLEAR SSS: 17
LHR: 0.3
LVDIAVOL: 108 mL
LVSYSVOL: 54 mL
Peak HR: 75 {beats}/min
Rest HR: 48 {beats}/min
SDS: 7
SRS: 10
TID: 1.06

## 2015-02-23 MED ORDER — SODIUM CHLORIDE 0.9 % IJ SOLN
INTRAMUSCULAR | Status: AC
  Administered 2015-02-23: 10 mL via INTRAVENOUS
  Filled 2015-02-23: qty 3

## 2015-02-23 MED ORDER — TECHNETIUM TC 99M SESTAMIBI - CARDIOLITE
30.0000 | Freq: Once | INTRAVENOUS | Status: AC | PRN
  Administered 2015-02-23: 12:00:00 30 via INTRAVENOUS

## 2015-02-23 MED ORDER — TECHNETIUM TC 99M SESTAMIBI GENERIC - CARDIOLITE
10.0000 | Freq: Once | INTRAVENOUS | Status: AC | PRN
  Administered 2015-02-23: 11 via INTRAVENOUS

## 2015-02-23 MED ORDER — REGADENOSON 0.4 MG/5ML IV SOLN
INTRAVENOUS | Status: AC
  Administered 2015-02-23: 0.4 mg via INTRAVENOUS
  Filled 2015-02-23: qty 5

## 2015-02-27 ENCOUNTER — Telehealth: Payer: Self-pay | Admitting: *Deleted

## 2015-02-27 DIAGNOSIS — I6529 Occlusion and stenosis of unspecified carotid artery: Secondary | ICD-10-CM

## 2015-02-27 DIAGNOSIS — I679 Cerebrovascular disease, unspecified: Secondary | ICD-10-CM

## 2015-02-27 NOTE — Telephone Encounter (Signed)
CAROTID DOPPLER -   Notes Recorded by Laurine Blazer, LPN on 29/19/1660 at 9:52 AM Wife Marlowe Kays) notified. Will put referral order in & forward to [facility]) for scheduling. Copy fwd to pmd.  Notes Recorded by Herminio Commons, MD on 02/23/2015 at 4:48 PM Should f/u with Dr. Scot Dock in [facility].

## 2015-02-27 NOTE — Telephone Encounter (Signed)
ECHO -   Notes Recorded by Laurine Blazer, LPN on 10/93/2355 at 9:50 AM Wife Marlowe Kays) notified. Copy fwd to pmd.  Notes Recorded by Herminio Commons, MD on 02/23/2015 at 2:41 PM Normal pumping function.

## 2015-02-27 NOTE — Telephone Encounter (Signed)
STRESS TEST -   Notes Recorded by Laurine Blazer, LPN on 80/32/1224 at 9:52 AM Copy fwd to pmd.  Notes Recorded by Laurine Blazer, LPN on 82/50/0370 at 9:50 AM Wife Marlowe Kays) notified.  Notes Recorded by Herminio Commons, MD on 02/23/2015 at 2:41 PM Evidence of prior heart attack, no new blockages.

## 2015-03-19 ENCOUNTER — Encounter: Payer: Self-pay | Admitting: Vascular Surgery

## 2015-03-20 ENCOUNTER — Encounter: Payer: Self-pay | Admitting: Vascular Surgery

## 2015-03-20 ENCOUNTER — Other Ambulatory Visit: Payer: Self-pay | Admitting: *Deleted

## 2015-03-20 DIAGNOSIS — I6523 Occlusion and stenosis of bilateral carotid arteries: Secondary | ICD-10-CM

## 2015-03-21 ENCOUNTER — Ambulatory Visit (HOSPITAL_COMMUNITY)
Admission: RE | Admit: 2015-03-21 | Discharge: 2015-03-21 | Disposition: A | Discharge status: Home / Self Care | Payer: Commercial Managed Care - HMO | Source: Ambulatory Visit | Attending: Vascular Surgery | Admitting: Vascular Surgery

## 2015-03-21 ENCOUNTER — Encounter: Payer: Self-pay | Admitting: Vascular Surgery

## 2015-03-21 ENCOUNTER — Ambulatory Visit (INDEPENDENT_AMBULATORY_CARE_PROVIDER_SITE_OTHER): Payer: Commercial Managed Care - HMO | Admitting: Vascular Surgery

## 2015-03-21 VITALS — BP 115/67 | HR 65 | Ht 68.0 in | Wt 172.6 lb

## 2015-03-21 DIAGNOSIS — I6523 Occlusion and stenosis of bilateral carotid arteries: Secondary | ICD-10-CM

## 2015-03-21 NOTE — Progress Notes (Signed)
[facility] [place]  Patient name: Edward Brown MRN: [id number] DOB: 1950-12-30 Sex: male  REASON FOR VISIT: Follow up of carotid disease  HPI: Edward Brown is a 64 y.o. male who has a complicated past vascular surgery history. 1. He underwent staged bilateral carotid endarterectomies in 2005. (Right carotid endarterectomy with Dacron patch and plasty on 09/28/2003, left carotid endarterectomy with Dacron patch angioplasty on 10/31/2003) 2. He developed a recurrent 80% right carotid stenosis and was found to have an occluded left carotid artery in 2010. He had a redo right carotid endarterectomy with a vein patch at that time and the dissection was fairly high. 3. He subsequently had an interposition vein graft in the right carotid system.  I last saw him back in 2014 when he underwent an arteriogram which showed that the right carotid system was widely patent without evidence of stenosis in the vein interposition graft. The left side was chronically occluded. He was then lost to follow up.  He reportedly had a duplex at an outlying lab where there was evidence of recurrent stenosis in the right carotid interposition vein graft. He was sent for vascular consultation. He denies any history of recent stroke, TIAs, expressive or receptive aphasia, or amaurosis fugax. He did have an episode in the past associated with some visual disturbance and dizziness but has not had any symptoms like this recently. He continues to smoke 2 packs per day of cigarettes.  Past Medical History  Diagnosis Date  . Hypertension     unspecified  . Hyperlipidemia, mixed   . CAD (coronary artery disease)     Native Vessel, Severe single-vessel coronary artery disease. a. status post aborted inferior myocardial infarction, followed by a bare-metal stenting of subtotal proximal RCA, January 2007. b. Residual nonobructive coronary artery disease. c. EF 50-55%; inferobasal akineses  . Cerebrovascular  disease     a. Status post prior bilateral carotid endarterectomy, with residual bilateral bruits. b. 50-69% right ICA stenosis, February 2009. c Bilateral, proximal ECA occlusion with distal collateralization  . Tobacco abuse   . Dyslipidemia   . Stroke ([place]) 2010  . Myocardial infarction [facility]) 2005    Family History  Problem Relation Age of Onset  . Coronary artery disease    . Dementia Mother   . Heart disease Father     Heart Disease before age 65  . Heart attack Father     SOCIAL HISTORY: Social History  Substance Use Topics  . Smoking status: Current Every Day Smoker -- 1.50 packs/day for 40 years    Types: Cigarettes  . Smokeless tobacco: Never Used  . Alcohol Use: No    Allergies  Allergen Reactions  . Codeine     REACTION: stomach upset  . Hydromorphone Hcl     REACTION: violent behavior  . Promethazine Hcl     REACTION: violent behaviors    Current Outpatient Prescriptions  Medication Sig Dispense Refill  . aspirin EC 81 MG tablet Take 1 tablet (81 mg total) by mouth daily.    Marland Kitchen atorvastatin (LIPITOR) 40 MG tablet Take 1 tablet (40 mg total) by mouth daily. 30 tablet 3  . isosorbide mononitrate (IMDUR) 30 MG 24 hr tablet Take 1 tablet (30 mg total) by mouth daily. 30 tablet 6  . lisinopril (PRINIVIL,ZESTRIL) 5 MG tablet Take 1 tablet (5 mg total) by mouth daily. 90 tablet 3  . metoprolol succinate (TOPROL-XL) 25 MG 24 hr tablet Take 1 tablet (25 mg  total) by mouth daily. 30 tablet 3  . nitroGLYCERIN (NITROSTAT) 0.4 MG SL tablet Place 1 tablet (0.4 mg total) under the tongue every 5 (five) minutes as needed for chest pain. 25 tablet 3  . citalopram (CELEXA) 40 MG tablet Take 1 tablet (40 mg total) by mouth daily.     No current facility-administered medications for this visit.    REVIEW OF SYSTEMS:  [X]  denotes positive finding, [ ]  denotes negative finding Cardiac  Comments:  Chest pain or chest pressure:    Shortness of breath upon exertion:    Short  of breath when lying flat:    Irregular heart rhythm:        Vascular    Pain in calf, thigh, or hip brought on by ambulation:    Pain in feet at night that wakes you up from your sleep:     Blood clot in your veins:    Leg swelling:         Pulmonary    Oxygen at home:    Productive cough:     Wheezing:         Neurologic    Sudden weakness in arms or legs:     Sudden numbness in arms or legs:     Sudden onset of difficulty speaking or slurred speech:    Temporary loss of vision in one eye:     Problems with dizziness:  X Described above      Gastrointestinal    Blood in stool:     Vomited blood:         Genitourinary    Burning when urinating:     Blood in urine:        Psychiatric    Major depression:         Hematologic    Bleeding problems:    Problems with blood clotting too easily:        Skin    Rashes or ulcers:        Constitutional    Fever or chills:      PHYSICAL EXAM: Filed Vitals:   03/21/15 1459 03/21/15 1501  BP: 111/73 115/67  Pulse: 65   Height: 5\' 8"  (1.727 m)   Weight: 172 lb 9.6 oz (78.291 kg)   SpO2: 95%     GENERAL: The patient is a well-nourished male, in no acute distress. The vital signs are documented above. CARDIAC: There is a regular rate and rhythm.  VASCULAR: he has bilateral carotid bruits. Both feet are warm and well perfused and he has no significant lower extremity swelling. PULMONARY: There is good air exchange bilaterally without wheezing or rales. ABDOMEN: Soft and non-tender with normal pitched bowel sounds.  MUSCULOSKELETAL: There are no major deformities or cyanosis. NEUROLOGIC: No focal weakness or paresthesias are detected. SKIN: There are no ulcers or rashes noted. PSYCHIATRIC: The patient has a normal affect.  DATA:  I have reviewed the arteriogram from May 2014. This showed no significant disease of the aortic arch. The left common carotid artery was occluded proximally. The subclavian arteries and  vertebral arteries were patent with no significant stenosis. The common carotid artery and internal carotid artery were widely patent on the right. The interposition vein graft on the right was patent with no stenosis identified.  I have independently interpreted his carotid duplex scan today which shows that his left carotid is chronically occluded. The right carotid interposition vein graft is widely patent without evidence of stenosis. There had been  a previous report of a 40-59% stenosis but this could not be appreciated on today's study.  MEDICAL ISSUES:   BILATERAL CAROTID DISEASE:  The patient has a complicated carotid history as described above. He has a chronic occlusion on the left. His interposition vein graft on the right is widely patent. I recommended continued yearly follow up. We have again discussed the importance of tobacco cessation. I have ordered a carotid duplex can for 1 year and I'll see him back at that time. He knows to call sooner if he has problems. I've also encouraged him to continue taking his aspirin and statin.   Deitra Mayo [facility] [place]: [phone]

## 2015-03-21 NOTE — Addendum Note (Signed)
Addended by: Dorthula Rue L on: 03/21/2015 04:42 PM   Modules accepted: Orders

## 2015-04-04 ENCOUNTER — Ambulatory Visit: Payer: Commercial Managed Care - HMO | Admitting: Cardiovascular Disease

## 2015-04-18 ENCOUNTER — Encounter: Payer: Self-pay | Admitting: Cardiovascular Disease

## 2015-04-18 ENCOUNTER — Ambulatory Visit (INDEPENDENT_AMBULATORY_CARE_PROVIDER_SITE_OTHER): Payer: Commercial Managed Care - HMO | Admitting: Cardiovascular Disease

## 2015-04-18 VITALS — BP 152/74 | HR 50 | Ht 68.0 in | Wt 180.0 lb

## 2015-04-18 DIAGNOSIS — R079 Chest pain, unspecified: Secondary | ICD-10-CM

## 2015-04-18 DIAGNOSIS — I6529 Occlusion and stenosis of unspecified carotid artery: Secondary | ICD-10-CM | POA: Diagnosis not present

## 2015-04-18 DIAGNOSIS — I6523 Occlusion and stenosis of bilateral carotid arteries: Secondary | ICD-10-CM

## 2015-04-18 DIAGNOSIS — H538 Other visual disturbances: Secondary | ICD-10-CM

## 2015-04-18 DIAGNOSIS — I25118 Atherosclerotic heart disease of native coronary artery with other forms of angina pectoris: Secondary | ICD-10-CM | POA: Diagnosis not present

## 2015-04-18 DIAGNOSIS — E785 Hyperlipidemia, unspecified: Secondary | ICD-10-CM

## 2015-04-18 DIAGNOSIS — I10 Essential (primary) hypertension: Secondary | ICD-10-CM

## 2015-04-18 DIAGNOSIS — Z720 Tobacco use: Secondary | ICD-10-CM

## 2015-04-18 DIAGNOSIS — R001 Bradycardia, unspecified: Secondary | ICD-10-CM

## 2015-04-18 MED ORDER — METOPROLOL TARTRATE 25 MG PO TABS
12.5000 mg | ORAL_TABLET | Freq: Every day | ORAL | Status: DC
Start: 2015-04-18 — End: 2015-04-18

## 2015-04-18 MED ORDER — METOPROLOL SUCCINATE ER 25 MG PO TB24: 12.5000 mg | ORAL_TABLET | Freq: Every day | ORAL | Status: AC

## 2015-04-18 MED ORDER — LISINOPRIL 10 MG PO TABS: 10.0000 mg | ORAL_TABLET | Freq: Every day | ORAL | Status: DC

## 2015-04-18 NOTE — Patient Instructions (Signed)
Medication Instructions:  INCREASE LISINOPRIL 10 MG DAILY DECREASE METOPROLOL 12.5 MG DAILY  Labwork: Your physician recommends that you return for lab work in: [place]   Testing/Procedures: NONE  Follow-Up: Your physician wants you to follow-up in: [place]. [place].  You will receive a reminder letter in the mail two months in advance. If you don't receive a letter, please call our office to schedule the follow-up appointment.   Any Other Special Instructions Will Be Listed Below (If Applicable).     If you need a refill on your cardiac medications before your next appointment, please call your pharmacy. Thanks for choosing [facility]!!!

## 2015-04-18 NOTE — Progress Notes (Signed)
Patient ID: Edward Brown, male   DOB: Sep 27, 1950, 64 y.o.   MRN: [id number]      SUBJECTIVE: The patient returns for follow-up after undergoing cardiovascular testing performed for the evaluation of chest pain and fatigue.  Nuclear stress test on 02/23/15 was low risk with evidence for large inferior wall infarct extending from the apex to the base with no evidence of ischemia.  Echocardiogram demonstrated normal left ventricular systolic function, EF [id number] , with normal regional wall motion.  Carotid Dopplers on 03/21/15 demonstrated occlusion of the left common and internal carotid artery. There was bilateral subclavian artery stenosis. There was a widely patent right carotid interpositional graft without evidence of restenosis or hyperplasia.  He sometimes feels dizzy but denies syncope. He has occasional episodes of chest pressure radiating to the roof of his mouth but this is infrequent. He sometimes has blurriness of vision and seeing gray while driving.    Review of Systems: As per "subjective", otherwise negative.  Allergies  Allergen Reactions  . Codeine     REACTION: stomach upset  . Hydromorphone Hcl     REACTION: violent behavior  . Promethazine Hcl     REACTION: violent behaviors    Current Outpatient Prescriptions  Medication Sig Dispense Refill  . aspirin EC 81 MG tablet Take 1 tablet (81 mg total) by mouth daily.    Marland Kitchen atorvastatin (LIPITOR) 40 MG tablet Take 1 tablet (40 mg total) by mouth daily. 30 tablet 3  . cetirizine (ZYRTEC) 10 MG tablet Take 10 mg by mouth daily.    . citalopram (CELEXA) 40 MG tablet Take 1 tablet (40 mg total) by mouth daily.    . isosorbide mononitrate (IMDUR) 30 MG 24 hr tablet Take 1 tablet (30 mg total) by mouth daily. 30 tablet 6  . lisinopril (PRINIVIL,ZESTRIL) 5 MG tablet Take 1 tablet (5 mg total) by mouth daily. 90 tablet 3  . metoprolol succinate (TOPROL-XL) 25 MG 24 hr tablet Take 1 tablet (25 mg total) by mouth daily. 30 tablet  3  . nitroGLYCERIN (NITROSTAT) 0.4 MG SL tablet Place 1 tablet (0.4 mg total) under the tongue every 5 (five) minutes as needed for chest pain. 25 tablet 3   No current facility-administered medications for this visit.    Past Medical History  Diagnosis Date  . Hypertension     unspecified  . Hyperlipidemia, mixed   . CAD (coronary artery disease)     Native Vessel, Severe single-vessel coronary artery disease. a. status post aborted inferior myocardial infarction, followed by a bare-metal stenting of subtotal proximal RCA, January 2007. b. Residual nonobructive coronary artery disease. c. EF 50-55%; inferobasal akineses  . Cerebrovascular disease     a. Status post prior bilateral carotid endarterectomy, with residual bilateral bruits. b. 50-69% right ICA stenosis, February 2009. c Bilateral, proximal ECA occlusion with distal collateralization  . Tobacco abuse   . Dyslipidemia   . Stroke ([place]) 2010  . Myocardial infarction [facility]) 2005    Past Surgical History  Procedure Laterality Date  . Carotid endarterectomy      Redo; right carotid endarterectomy with replacement of carotid artery with interpostion greater saphenous vein from the left leg  . Spine surgery  Aug. 2012  . Arch aortogram N/A 09/20/2012    Procedure: ARCH AORTOGRAM;  Surgeon: Angelia Mould, MD;  Location: [facility] CATH LAB;  Service: Cardiovascular;  Laterality: N/A;    Social History   Social History  . Marital Status: Married  Spouse Name: N/A  . Number of Children: N/A  . Years of Education: N/A   Occupational History  . Not on file.   Social History Main Topics  . Smoking status: Current Every Day Smoker -- 1.50 packs/day for 40 years    Types: Cigarettes  . Smokeless tobacco: Never Used  . Alcohol Use: No  . Drug Use: No  . Sexual Activity: Not on file   Other Topics Concern  . Not on file   Social History Narrative     Filed Vitals:   04/18/15 0951  BP: 152/74  Pulse: 50  Height:  5\' 8"  (1.727 m)  Weight: 180 lb (81.647 kg)  SpO2: 92%    PHYSICAL EXAM General: NAD HEENT: Normal. Neck: No JVD, no thyromegaly. Lungs: Clear to auscultation bilaterally with normal respiratory effort. CV: Bradycardic, regular rhythm, normal S1/S2, no S3/S4, no murmur. No pretibial or periankle edema.  Abdomen: Soft, nontender, no distention.  Neurologic: Alert and oriented x 3.  Psych: Normal affect. Skin: Normal. Musculoskeletal: No gross deformities. Extremities: No clubbing or cyanosis.   ECG: Most recent ECG reviewed.      ASSESSMENT AND PLAN: 1. Fatigue and chest pain in context of CAD with h/o inferior MI and RCA stent: Currently on good medical therapy with aspirin, Lipitor, Imdur, lisinopril, and metoprolol. No ischemia with stress testing as noted above. Given bradycardia, reduce Toprol-XL to 12.5 mg daily. This may be cause of dizziness and perhaps blurriness of vision.  2. Essential HTN: Elevated. Increase lisinopril to 10 mg daily.  3. Carotid artery disease: Dopplers reviewed above. Follows with vascular surgery.  4. Dyslipidemia: On Lipitor. Lipids on 02/07/15 showed total cholesterol 225, triglycerides 161, HDL 33, LDL 160. He had not been taking regularly but is now taking daily since his appt with me on 02/16/15. Will repeat lipids in 3 months.  5. Tobacco abuse: Counseled to quit at last visit. He is still smoking.  Dispo: f/u 6 months.   Kate Sable, M.D., F.A.C.C.

## 2015-07-04 DIAGNOSIS — Z1389 Encounter for screening for other disorder: Secondary | ICD-10-CM | POA: Diagnosis not present

## 2015-07-04 DIAGNOSIS — Z6826 Body mass index (BMI) 26.0-26.9, adult: Secondary | ICD-10-CM | POA: Diagnosis not present

## 2015-07-04 DIAGNOSIS — E663 Overweight: Secondary | ICD-10-CM | POA: Diagnosis not present

## 2015-07-04 DIAGNOSIS — J209 Acute bronchitis, unspecified: Secondary | ICD-10-CM | POA: Diagnosis not present

## 2015-07-04 DIAGNOSIS — Z0001 Encounter for general adult medical examination with abnormal findings: Secondary | ICD-10-CM | POA: Diagnosis not present

## 2015-07-04 DIAGNOSIS — J0100 Acute maxillary sinusitis, unspecified: Secondary | ICD-10-CM | POA: Diagnosis not present

## 2015-11-23 DIAGNOSIS — Z6826 Body mass index (BMI) 26.0-26.9, adult: Secondary | ICD-10-CM | POA: Diagnosis not present

## 2015-11-23 DIAGNOSIS — I10 Essential (primary) hypertension: Secondary | ICD-10-CM | POA: Diagnosis not present

## 2015-11-23 DIAGNOSIS — F329 Major depressive disorder, single episode, unspecified: Secondary | ICD-10-CM | POA: Diagnosis not present

## 2015-11-23 DIAGNOSIS — Z1389 Encounter for screening for other disorder: Secondary | ICD-10-CM | POA: Diagnosis not present

## 2015-11-23 DIAGNOSIS — I881 Chronic lymphadenitis, except mesenteric: Secondary | ICD-10-CM | POA: Diagnosis not present

## 2015-11-23 DIAGNOSIS — I2510 Atherosclerotic heart disease of native coronary artery without angina pectoris: Secondary | ICD-10-CM | POA: Diagnosis not present

## 2015-11-23 DIAGNOSIS — E782 Mixed hyperlipidemia: Secondary | ICD-10-CM | POA: Diagnosis not present

## 2016-03-26 ENCOUNTER — Encounter: Payer: Self-pay | Admitting: Family

## 2016-03-26 ENCOUNTER — Ambulatory Visit: Payer: Commercial Managed Care - HMO | Admitting: Family

## 2016-03-26 ENCOUNTER — Encounter (HOSPITAL_COMMUNITY): Payer: Commercial Managed Care - HMO

## 2016-04-02 ENCOUNTER — Encounter: Payer: Self-pay | Admitting: Family

## 2016-04-02 ENCOUNTER — Ambulatory Visit (INDEPENDENT_AMBULATORY_CARE_PROVIDER_SITE_OTHER): Payer: Commercial Managed Care - HMO | Admitting: Family

## 2016-04-02 ENCOUNTER — Other Ambulatory Visit: Payer: Self-pay | Admitting: *Deleted

## 2016-04-02 ENCOUNTER — Ambulatory Visit (HOSPITAL_COMMUNITY)
Admission: RE | Admit: 2016-04-02 | Discharge: 2016-04-02 | Disposition: A | Discharge status: Home / Self Care | Payer: Commercial Managed Care - HMO | Source: Ambulatory Visit | Attending: Family | Admitting: Family

## 2016-04-02 VITALS — BP 109/54 | HR 53 | Temp 97.0°F | Ht 68.0 in | Wt 170.0 lb

## 2016-04-02 DIAGNOSIS — I6523 Occlusion and stenosis of bilateral carotid arteries: Secondary | ICD-10-CM | POA: Diagnosis not present

## 2016-04-02 DIAGNOSIS — I6522 Occlusion and stenosis of left carotid artery: Secondary | ICD-10-CM | POA: Diagnosis not present

## 2016-04-02 DIAGNOSIS — I6521 Occlusion and stenosis of right carotid artery: Secondary | ICD-10-CM | POA: Diagnosis not present

## 2016-04-02 DIAGNOSIS — Z9889 Other specified postprocedural states: Secondary | ICD-10-CM

## 2016-04-02 NOTE — Progress Notes (Signed)
Chief Complaint: Follow up Extracranial Carotid Artery Stenosis   History of Present Illness  Edward Brown is a 65 y.o. male patient of Dr. Scot Dock who has a complicated past vascular surgery history. He had a preoperative stroke as manifested by visual disturbance and syncope, had transient right hemiparesis and expressive aphasia.  1. He underwent staged bilateral carotid endarterectomies in 2005. (Right carotid endarterectomy with Dacron patch and plasty on 09/28/2003, left carotid endarterectomy with Dacron patch angioplasty on 10/31/2003) 2. He developed a recurrent 80% right carotid stenosis and was found to have an occluded left carotid artery in 2010. He had a redo right carotid endarterectomy with a vein patch at that time and the dissection was fairly high. 3. He subsequently had an interposition vein graft in the right carotid system.   Dr. Scot Dock saw pt in 2014 when he underwent an arteriogram which showed that the right carotid system was widely patent without evidence of stenosis in the vein interposition graft. The left side was chronically occluded. He was then lost to follow up.  He reportedly had a duplex at an outlying lab where there was evidence of recurrent stenosis in the right carotid interposition vein graft. He was sent for vascular consultation. He denies any history of recent stroke, TIAs, expressive or receptive aphasia, or amaurosis fugax. He did have an episode in the past associated with some visual disturbance and dizziness but has not had any symptoms like this recently. He continues to smoke.  Dr. Scot Dock last saw pt on 03-21-15. At that time carotid duplex scan showed that his left carotid was chronically occluded. The right carotid interposition vein graft was widely patent without evidence of stenosis. There had been a previous report of a 40-59% stenosis.  He reports syncopal or presyncopal episodes in th last 2 weeks; states he did not inform his  cardiologist, Dr. Annice Needy; I advised him to do so.  Pt states he does not see a neurologist.    Pt Diabetic: no Pt smoker: smoker  (1.5 ppd, started at age 27 yrs)  Pt meds include: Statin : yes ASA: yes Other anticoagulants/antiplatelets: no   Past Medical History:  Diagnosis Date  . CAD (coronary artery disease)    [place], Severe single-vessel coronary artery disease. a. status post aborted inferior myocardial infarction, followed by a bare-metal stenting of subtotal proximal RCA, January 2007. b. Residual nonobructive coronary artery disease. c. EF 50-55%; inferobasal akineses  . Cerebrovascular disease    a. Status post prior bilateral carotid endarterectomy, with residual bilateral bruits. b. 50-69% right ICA stenosis, February 2009. c Bilateral, proximal ECA occlusion with distal collateralization  . Dyslipidemia   . Hyperlipidemia, mixed   . Hypertension    unspecified  . Myocardial infarction 2005  . Stroke ([place]) 2010  . Tobacco abuse     Social History Social History  Substance Use Topics  . Smoking status: Current Every Day Smoker    Packs/day: 1.50    Years: 40.00    Types: Cigarettes  . Smokeless tobacco: Never Used  . Alcohol use No    Family History Family History  Problem Relation Age of Onset  . Coronary artery disease    . Dementia Mother   . Heart disease Father     Heart Disease before age 5  . Heart attack Father     Surgical History Past Surgical History:  Procedure Laterality Date  . ARCH AORTOGRAM N/A 09/20/2012   Procedure: ARCH AORTOGRAM;  Surgeon: Harrell Gave  Nicole Cella, MD;  Location: [facility];  Service: Cardiovascular;  Laterality: N/A;  . CAROTID ENDARTERECTOMY     Redo; right carotid endarterectomy with replacement of carotid artery with interpostion greater saphenous vein from the left leg  . SPINE SURGERY  Aug. 2012    Allergies  Allergen Reactions  . Codeine     REACTION: stomach upset  . Hydromorphone Hcl      REACTION: violent behavior  . Promethazine Hcl     REACTION: violent behaviors    Current Outpatient Prescriptions  Medication Sig Dispense Refill  . aspirin EC 81 MG tablet Take 1 tablet (81 mg total) by mouth daily.    Marland Kitchen atorvastatin (LIPITOR) 80 MG tablet   0  . cetirizine (ZYRTEC) 10 MG tablet Take 10 mg by mouth daily.    Marland Kitchen lisinopril (PRINIVIL,ZESTRIL) 10 MG tablet Take 1 tablet (10 mg total) by mouth daily. 90 tablet 3  . metoprolol succinate (TOPROL XL) 25 MG 24 hr tablet Take 0.5 tablets (12.5 mg total) by mouth daily. 45 tablet 3  . citalopram (CELEXA) 40 MG tablet Take 1 tablet (40 mg total) by mouth daily.    . isosorbide mononitrate (IMDUR) 30 MG 24 hr tablet Take 1 tablet (30 mg total) by mouth daily. (Patient not taking: Reported on 04/02/2016) 30 tablet 6  . nitroGLYCERIN (NITROSTAT) 0.4 MG SL tablet Place 1 tablet (0.4 mg total) under the tongue every 5 (five) minutes as needed for chest pain. 25 tablet 3   No current facility-administered medications for this visit.     Review of Systems : See HPI for pertinent positives and negatives.  Physical Examination  Vitals:   04/02/16 0941 04/02/16 0945  BP: (!) 103/51 (!) 109/54  Pulse: (!) 53   Temp: 97 F (36.1 C)   SpO2: 95%   Weight: 170 lb (77.1 kg)   Height: 5\' 8"  (1.727 m)    Body mass index is 25.85 kg/m.  General: WDWN male in NAD GAIT: normal Eyes: PERRLA Pulmonary:  Respirations are non-labored, good air movement, CTAB Cardiac: regular rhythm, no detected murmur.  VASCULAR EXAM Carotid Bruits Right Left   Positive Positive    Aorta is not palpable. Radial pulses are 2+ palpable and equal.                                                                                                                            LE Pulses Right Left       POPLITEAL  not palpable   not palpable       POSTERIOR TIBIAL  2+ palpable   2+ palpable        DORSALIS PEDIS      ANTERIOR TIBIAL not palpable  not  palpable     Gastrointestinal: soft, nontender, BS WNL, no r/g, no palpable masses.  Musculoskeletal: No muscle atrophy/wasting. M/S 5/5 throughout, extremities without ischemic changes.  Neurologic: A&O X 3; Appropriate Affect, Speech is  normal CN 2-12 intact, pain and light touch intact in extremities, Motor exam as listed above.     Assessment: Edward Brown is a 65 y.o. male who has a known occlusion of the left CCA, is s/p right CEA on 09-28-03 with interpositional graft of CCA on 10-05-08. He had a preoperative stroke, does not appear to have has any subsequent strokes or TIA's.   He does not have DM, but he continues to smoke.   DATA Today's carotid duplex suggests no restenosis of the right CEA. Distal right ICA velocities are elevated but this is most likely due to compensatory flow. Left ICA and CCA occlusions. Bilateral ECA is occluded proximally with retrograde branch flow. Bilateral vertebral artery flow is antegrade. Bilateral subclavian artery waveforms are stenotic. No significant change compared ti the previous exam on 03-21-15.    Plan:  The patient was counseled re smoking cessation and given several free resources re smoking cessation.   Follow-up in 1 year with Carotid Duplex scan.   I discussed in depth with the patient the nature of atherosclerosis, and emphasized the importance of maximal medical management including strict control of blood pressure, blood glucose, and lipid levels, obtaining regular exercise, and cessation of smoking.  The patient is aware that without maximal medical management the underlying atherosclerotic disease process will progress, limiting the benefit of any interventions. The patient was given information about stroke prevention and what symptoms should prompt the patient to seek immediate medical care. Thank you for allowing [place] to participate in this patient's care.  Clemon Chambers, RN, MSN, FNP-C [facility] Office: [phone]   04/02/16 9:54 AM

## 2016-04-02 NOTE — Patient Instructions (Signed)
Stroke Prevention Some medical conditions and behaviors are associated with an increased chance of having a stroke. You may prevent a stroke by making healthy choices and managing medical conditions. How can I reduce my risk of having a stroke?  Stay physically active. Get at least 30 minutes of activity on most or all days.  Do not smoke. It may also be helpful to avoid exposure to secondhand smoke.  Limit alcohol use. Moderate alcohol use is considered to be:  No more than 2 drinks per day for men.  No more than 1 drink per day for nonpregnant women.  Eat healthy foods. This involves:  Eating 5 or more servings of fruits and vegetables a day.  Making dietary changes that address high blood pressure (hypertension), high cholesterol, diabetes, or obesity.  Manage your cholesterol levels.  Making food choices that are high in fiber and low in saturated fat, trans fat, and cholesterol may control cholesterol levels.  Take any prescribed medicines to control cholesterol as directed by your health care provider.  Manage your diabetes.  Controlling your carbohydrate and sugar intake is recommended to manage diabetes.  Take any prescribed medicines to control diabetes as directed by your health care provider.  Control your hypertension.  Making food choices that are low in salt (sodium), saturated fat, trans fat, and cholesterol is recommended to manage hypertension.  Ask your health care provider if you need treatment to lower your blood pressure. Take any prescribed medicines to control hypertension as directed by your health care provider.  If you are 18-39 years of age, have your blood pressure checked every 3-5 years. If you are 40 years of age or older, have your blood pressure checked every year.  Maintain a healthy weight.  Reducing calorie intake and making food choices that are low in sodium, saturated fat, trans fat, and cholesterol are recommended to manage  weight.  Stop drug abuse.  Avoid taking birth control pills.  Talk to your health care provider about the risks of taking birth control pills if you are over 35 years old, smoke, get migraines, or have ever had a blood clot.  Get evaluated for sleep disorders (sleep apnea).  Talk to your health care provider about getting a sleep evaluation if you snore a lot or have excessive sleepiness.  Take medicines only as directed by your health care provider.  For some people, aspirin or blood thinners (anticoagulants) are helpful in reducing the risk of forming abnormal blood clots that can lead to stroke. If you have the irregular heart rhythm of atrial fibrillation, you should be on a blood thinner unless there is a good reason you cannot take them.  Understand all your medicine instructions.  Make sure that other conditions (such as anemia or atherosclerosis) are addressed. Get help right away if:  You have sudden weakness or numbness of the face, arm, or leg, especially on one side of the body.  Your face or eyelid droops to one side.  You have sudden confusion.  You have trouble speaking (aphasia) or understanding.  You have sudden trouble seeing in one or both eyes.  You have sudden trouble walking.  You have dizziness.  You have a loss of balance or coordination.  You have a sudden, severe headache with no known cause.  You have new chest pain or an irregular heartbeat. Any of these symptoms may represent a serious problem that is an emergency. Do not wait to see if the symptoms will go away.   Get medical help at once. Call your local emergency services (911 in [place] Do not drive yourself to the hospital.  This information is not intended to replace advice given to you by your health care provider. Make sure you discuss any questions you have with your health care provider. Document Released: 05/29/2004 Document Revised: 09/27/2015 Document Reviewed: 10/22/2012 Elsevier  Interactive Patient Education  2017 Elsevier Inc.     Steps to Quit Smoking Smoking tobacco can be bad for your health. It can also affect almost every organ in your body. Smoking puts you and people around you at risk for many serious long-lasting (chronic) diseases. Quitting smoking is hard, but it is one of the best things that you can do for your health. It is never too late to quit. What are the benefits of quitting smoking? When you quit smoking, you lower your risk for getting serious diseases and conditions. They can include:  Lung cancer or lung disease.  Heart disease.  Stroke.  Heart attack.  Not being able to have children (infertility).  Weak bones (osteoporosis) and broken bones (fractures). If you have coughing, wheezing, and shortness of breath, those symptoms may get better when you quit. You may also get sick less often. If you are pregnant, quitting smoking can help to lower your chances of having a baby of low birth weight. What can I do to help me quit smoking? Talk with your doctor about what can help you quit smoking. Some things you can do (strategies) include:  Quitting smoking totally, instead of slowly cutting back how much you smoke over a period of time.  Going to in-person counseling. You are more likely to quit if you go to many counseling sessions.  Using resources and support systems, such as:  Online chats with a counselor.  Phone quitlines.  Printed self-help materials.  Support groups or group counseling.  Text messaging programs.  Mobile phone apps or applications.  Taking medicines. Some of these medicines may have nicotine in them. If you are pregnant or breastfeeding, do not take any medicines to quit smoking unless your doctor says it is okay. Talk with your doctor about counseling or other things that can help you. Talk with your doctor about using more than one strategy at the same time, such as taking medicines while you are  also going to in-person counseling. This can help make quitting easier. What things can I do to make it easier to quit? Quitting smoking might feel very hard at first, but there is a lot that you can do to make it easier. Take these steps:  Talk to your family and friends. Ask them to support and encourage you.  Call phone quitlines, reach out to support groups, or work with a counselor.  Ask people who smoke to not smoke around you.  Avoid places that make you want (trigger) to smoke, such as:  Bars.  Parties.  Smoke-break areas at work.  Spend time with people who do not smoke.  Lower the stress in your life. Stress can make you want to smoke. Try these things to help your stress:  Getting regular exercise.  Deep-breathing exercises.  Yoga.  Meditating.  Doing a body scan. To do this, close your eyes, focus on one area of your body at a time from head to toe, and notice which parts of your body are tense. Try to relax the muscles in those areas.  Download or buy apps on your mobile phone or tablet   that can help you stick to your quit plan. There are many free apps, such as QuitGuide from the CDC (Centers for Disease Control and Prevention). You can find more support from smokefree.gov and other websites. This information is not intended to replace advice given to you by your health care provider. Make sure you discuss any questions you have with your health care provider. Document Released: 02/15/2009 Document Revised: 12/18/2015 Document Reviewed: 09/05/2014 Elsevier Interactive Patient Education  2017 Elsevier Inc.  

## 2016-07-07 DIAGNOSIS — E663 Overweight: Secondary | ICD-10-CM | POA: Diagnosis not present

## 2016-07-07 DIAGNOSIS — Z6826 Body mass index (BMI) 26.0-26.9, adult: Secondary | ICD-10-CM | POA: Diagnosis not present

## 2016-07-07 DIAGNOSIS — E748 Other specified disorders of carbohydrate metabolism: Secondary | ICD-10-CM | POA: Diagnosis not present

## 2016-07-07 DIAGNOSIS — Z1389 Encounter for screening for other disorder: Secondary | ICD-10-CM | POA: Diagnosis not present

## 2016-07-07 DIAGNOSIS — L570 Actinic keratosis: Secondary | ICD-10-CM | POA: Diagnosis not present

## 2016-07-07 DIAGNOSIS — D485 Neoplasm of uncertain behavior of skin: Secondary | ICD-10-CM | POA: Diagnosis not present

## 2016-07-07 DIAGNOSIS — Z0001 Encounter for general adult medical examination with abnormal findings: Secondary | ICD-10-CM | POA: Diagnosis not present

## 2016-07-24 DIAGNOSIS — L820 Inflamed seborrheic keratosis: Secondary | ICD-10-CM | POA: Diagnosis not present

## 2016-07-24 DIAGNOSIS — L570 Actinic keratosis: Secondary | ICD-10-CM | POA: Diagnosis not present

## 2016-07-24 DIAGNOSIS — D225 Melanocytic nevi of trunk: Secondary | ICD-10-CM | POA: Diagnosis not present

## 2016-07-24 DIAGNOSIS — X32XXXA Exposure to sunlight, initial encounter: Secondary | ICD-10-CM | POA: Diagnosis not present

## 2016-09-04 DIAGNOSIS — B356 Tinea cruris: Secondary | ICD-10-CM | POA: Diagnosis not present

## 2016-09-04 DIAGNOSIS — L259 Unspecified contact dermatitis, unspecified cause: Secondary | ICD-10-CM | POA: Diagnosis not present

## 2016-11-02 DIAGNOSIS — E784 Other hyperlipidemia: Secondary | ICD-10-CM | POA: Diagnosis not present

## 2016-11-02 DIAGNOSIS — L258 Unspecified contact dermatitis due to other agents: Secondary | ICD-10-CM | POA: Diagnosis not present

## 2016-11-25 DIAGNOSIS — Z79891 Long term (current) use of opiate analgesic: Secondary | ICD-10-CM | POA: Diagnosis not present

## 2016-11-25 DIAGNOSIS — I10 Essential (primary) hypertension: Secondary | ICD-10-CM | POA: Diagnosis not present

## 2016-11-25 DIAGNOSIS — E782 Mixed hyperlipidemia: Secondary | ICD-10-CM | POA: Diagnosis not present

## 2016-11-25 DIAGNOSIS — Z6826 Body mass index (BMI) 26.0-26.9, adult: Secondary | ICD-10-CM | POA: Diagnosis not present

## 2016-11-25 DIAGNOSIS — G894 Chronic pain syndrome: Secondary | ICD-10-CM | POA: Diagnosis not present

## 2016-11-25 DIAGNOSIS — Z79899 Other long term (current) drug therapy: Secondary | ICD-10-CM | POA: Diagnosis not present

## 2017-02-23 DIAGNOSIS — J329 Chronic sinusitis, unspecified: Secondary | ICD-10-CM | POA: Diagnosis not present

## 2017-02-23 DIAGNOSIS — I2510 Atherosclerotic heart disease of native coronary artery without angina pectoris: Secondary | ICD-10-CM | POA: Diagnosis not present

## 2017-02-23 DIAGNOSIS — E782 Mixed hyperlipidemia: Secondary | ICD-10-CM | POA: Diagnosis not present

## 2017-02-23 DIAGNOSIS — I10 Essential (primary) hypertension: Secondary | ICD-10-CM | POA: Diagnosis not present

## 2017-02-23 DIAGNOSIS — Z6826 Body mass index (BMI) 26.0-26.9, adult: Secondary | ICD-10-CM | POA: Diagnosis not present

## 2017-02-23 DIAGNOSIS — G894 Chronic pain syndrome: Secondary | ICD-10-CM | POA: Diagnosis not present

## 2017-02-23 DIAGNOSIS — R42 Dizziness and giddiness: Secondary | ICD-10-CM | POA: Diagnosis not present

## 2017-04-02 ENCOUNTER — Encounter (HOSPITAL_COMMUNITY): Payer: Commercial Managed Care - HMO

## 2017-04-02 ENCOUNTER — Ambulatory Visit: Payer: Medicare HMO | Admitting: Family

## 2018-07-13 DIAGNOSIS — E782 Mixed hyperlipidemia: Secondary | ICD-10-CM | POA: Diagnosis not present

## 2018-07-13 DIAGNOSIS — Z1389 Encounter for screening for other disorder: Secondary | ICD-10-CM | POA: Diagnosis not present

## 2018-07-13 DIAGNOSIS — G894 Chronic pain syndrome: Secondary | ICD-10-CM | POA: Diagnosis not present

## 2018-07-13 DIAGNOSIS — E669 Obesity, unspecified: Secondary | ICD-10-CM | POA: Diagnosis not present

## 2018-07-13 DIAGNOSIS — I10 Essential (primary) hypertension: Secondary | ICD-10-CM | POA: Diagnosis not present

## 2018-07-13 DIAGNOSIS — Z6826 Body mass index (BMI) 26.0-26.9, adult: Secondary | ICD-10-CM | POA: Diagnosis not present

## 2018-07-13 DIAGNOSIS — Z0001 Encounter for general adult medical examination with abnormal findings: Secondary | ICD-10-CM | POA: Diagnosis not present

## 2018-07-13 DIAGNOSIS — M66829 Spontaneous rupture of other tendons, unspecified upper arm: Secondary | ICD-10-CM | POA: Diagnosis not present

## 2018-07-13 DIAGNOSIS — Z125 Encounter for screening for malignant neoplasm of prostate: Secondary | ICD-10-CM | POA: Diagnosis not present

## 2018-11-02 DIAGNOSIS — L723 Sebaceous cyst: Secondary | ICD-10-CM | POA: Diagnosis not present

## 2018-11-02 DIAGNOSIS — G894 Chronic pain syndrome: Secondary | ICD-10-CM | POA: Diagnosis not present

## 2018-11-02 DIAGNOSIS — E663 Overweight: Secondary | ICD-10-CM | POA: Diagnosis not present

## 2018-11-02 DIAGNOSIS — Z1389 Encounter for screening for other disorder: Secondary | ICD-10-CM | POA: Diagnosis not present

## 2018-11-02 DIAGNOSIS — Z6825 Body mass index (BMI) 25.0-25.9, adult: Secondary | ICD-10-CM | POA: Diagnosis not present

## 2018-12-10 DIAGNOSIS — I10 Essential (primary) hypertension: Secondary | ICD-10-CM | POA: Diagnosis not present

## 2018-12-10 DIAGNOSIS — Z6824 Body mass index (BMI) 24.0-24.9, adult: Secondary | ICD-10-CM | POA: Diagnosis not present

## 2018-12-10 DIAGNOSIS — R55 Syncope and collapse: Secondary | ICD-10-CM | POA: Diagnosis not present

## 2018-12-10 DIAGNOSIS — I6529 Occlusion and stenosis of unspecified carotid artery: Secondary | ICD-10-CM | POA: Diagnosis not present

## 2018-12-19 DIAGNOSIS — R630 Anorexia: Secondary | ICD-10-CM | POA: Diagnosis not present

## 2018-12-19 DIAGNOSIS — G43009 Migraine without aura, not intractable, without status migrainosus: Secondary | ICD-10-CM | POA: Diagnosis not present

## 2018-12-19 DIAGNOSIS — G43909 Migraine, unspecified, not intractable, without status migrainosus: Secondary | ICD-10-CM | POA: Diagnosis not present

## 2018-12-19 DIAGNOSIS — Z8673 Personal history of transient ischemic attack (TIA), and cerebral infarction without residual deficits: Secondary | ICD-10-CM | POA: Diagnosis not present

## 2018-12-19 DIAGNOSIS — R51 Headache: Secondary | ICD-10-CM | POA: Diagnosis not present

## 2018-12-19 DIAGNOSIS — R110 Nausea: Secondary | ICD-10-CM | POA: Diagnosis not present

## 2018-12-19 DIAGNOSIS — R42 Dizziness and giddiness: Secondary | ICD-10-CM | POA: Diagnosis not present

## 2018-12-19 DIAGNOSIS — I2510 Atherosclerotic heart disease of native coronary artery without angina pectoris: Secondary | ICD-10-CM | POA: Diagnosis not present

## 2018-12-19 DIAGNOSIS — F17200 Nicotine dependence, unspecified, uncomplicated: Secondary | ICD-10-CM | POA: Diagnosis not present

## 2018-12-19 DIAGNOSIS — Z885 Allergy status to narcotic agent status: Secondary | ICD-10-CM | POA: Diagnosis not present

## 2018-12-19 DIAGNOSIS — Z888 Allergy status to other drugs, medicaments and biological substances status: Secondary | ICD-10-CM | POA: Diagnosis not present

## 2018-12-19 DIAGNOSIS — Z79899 Other long term (current) drug therapy: Secondary | ICD-10-CM | POA: Diagnosis not present

## 2018-12-22 ENCOUNTER — Other Ambulatory Visit: Payer: Self-pay | Admitting: Internal Medicine

## 2018-12-22 DIAGNOSIS — I6523 Occlusion and stenosis of bilateral carotid arteries: Secondary | ICD-10-CM

## 2018-12-24 ENCOUNTER — Ambulatory Visit (HOSPITAL_COMMUNITY)
Admission: RE | Admit: 2018-12-24 | Discharge: 2018-12-24 | Disposition: A | Discharge status: Home / Self Care | Payer: Medicare HMO | Source: Ambulatory Visit | Attending: Internal Medicine | Admitting: Internal Medicine

## 2018-12-24 ENCOUNTER — Other Ambulatory Visit: Payer: Self-pay

## 2018-12-24 DIAGNOSIS — I6523 Occlusion and stenosis of bilateral carotid arteries: Secondary | ICD-10-CM | POA: Insufficient documentation

## 2018-12-24 DIAGNOSIS — I6521 Occlusion and stenosis of right carotid artery: Secondary | ICD-10-CM | POA: Diagnosis not present

## 2019-01-25 ENCOUNTER — Other Ambulatory Visit: Payer: Self-pay

## 2019-01-25 DIAGNOSIS — I6529 Occlusion and stenosis of unspecified carotid artery: Secondary | ICD-10-CM

## 2019-01-26 ENCOUNTER — Encounter: Payer: Medicare HMO | Admitting: Vascular Surgery

## 2019-01-26 ENCOUNTER — Ambulatory Visit (HOSPITAL_COMMUNITY): Payer: Medicare HMO | Attending: Internal Medicine

## 2019-01-27 ENCOUNTER — Encounter: Payer: Self-pay | Admitting: Family

## 2019-03-23 DIAGNOSIS — Z6824 Body mass index (BMI) 24.0-24.9, adult: Secondary | ICD-10-CM | POA: Diagnosis not present

## 2019-03-23 DIAGNOSIS — I10 Essential (primary) hypertension: Secondary | ICD-10-CM | POA: Diagnosis not present

## 2019-03-23 DIAGNOSIS — J329 Chronic sinusitis, unspecified: Secondary | ICD-10-CM | POA: Diagnosis not present

## 2019-07-15 DIAGNOSIS — I6523 Occlusion and stenosis of bilateral carotid arteries: Secondary | ICD-10-CM | POA: Diagnosis not present

## 2019-07-15 DIAGNOSIS — E7849 Other hyperlipidemia: Secondary | ICD-10-CM | POA: Diagnosis not present

## 2019-07-15 DIAGNOSIS — Z0001 Encounter for general adult medical examination with abnormal findings: Secondary | ICD-10-CM | POA: Diagnosis not present

## 2019-07-15 DIAGNOSIS — Z0000 Encounter for general adult medical examination without abnormal findings: Secondary | ICD-10-CM | POA: Diagnosis not present

## 2019-07-15 DIAGNOSIS — I10 Essential (primary) hypertension: Secondary | ICD-10-CM | POA: Diagnosis not present

## 2019-07-15 DIAGNOSIS — Z125 Encounter for screening for malignant neoplasm of prostate: Secondary | ICD-10-CM | POA: Diagnosis not present

## 2019-07-15 DIAGNOSIS — G8929 Other chronic pain: Secondary | ICD-10-CM | POA: Diagnosis not present

## 2019-07-15 DIAGNOSIS — M5412 Radiculopathy, cervical region: Secondary | ICD-10-CM | POA: Diagnosis not present

## 2019-07-15 DIAGNOSIS — Z6824 Body mass index (BMI) 24.0-24.9, adult: Secondary | ICD-10-CM | POA: Diagnosis not present

## 2019-07-15 DIAGNOSIS — Z1389 Encounter for screening for other disorder: Secondary | ICD-10-CM | POA: Diagnosis not present

## 2019-12-08 DIAGNOSIS — I10 Essential (primary) hypertension: Secondary | ICD-10-CM | POA: Diagnosis not present

## 2019-12-08 DIAGNOSIS — Z6826 Body mass index (BMI) 26.0-26.9, adult: Secondary | ICD-10-CM | POA: Diagnosis not present

## 2019-12-08 DIAGNOSIS — G894 Chronic pain syndrome: Secondary | ICD-10-CM | POA: Diagnosis not present

## 2019-12-08 DIAGNOSIS — E663 Overweight: Secondary | ICD-10-CM | POA: Diagnosis not present

## 2019-12-26 DIAGNOSIS — R3915 Urgency of urination: Secondary | ICD-10-CM | POA: Diagnosis not present

## 2019-12-26 DIAGNOSIS — E7849 Other hyperlipidemia: Secondary | ICD-10-CM | POA: Diagnosis not present

## 2019-12-26 DIAGNOSIS — R109 Unspecified abdominal pain: Secondary | ICD-10-CM | POA: Diagnosis not present

## 2019-12-26 DIAGNOSIS — I10 Essential (primary) hypertension: Secondary | ICD-10-CM | POA: Diagnosis not present

## 2019-12-26 DIAGNOSIS — Z6826 Body mass index (BMI) 26.0-26.9, adult: Secondary | ICD-10-CM | POA: Diagnosis not present

## 2019-12-26 DIAGNOSIS — E663 Overweight: Secondary | ICD-10-CM | POA: Diagnosis not present

## 2019-12-26 DIAGNOSIS — I209 Angina pectoris, unspecified: Secondary | ICD-10-CM | POA: Diagnosis not present

## 2019-12-31 DIAGNOSIS — Z885 Allergy status to narcotic agent status: Secondary | ICD-10-CM | POA: Diagnosis not present

## 2019-12-31 DIAGNOSIS — F17200 Nicotine dependence, unspecified, uncomplicated: Secondary | ICD-10-CM | POA: Diagnosis not present

## 2019-12-31 DIAGNOSIS — I200 Unstable angina: Secondary | ICD-10-CM | POA: Diagnosis not present

## 2019-12-31 DIAGNOSIS — I252 Old myocardial infarction: Secondary | ICD-10-CM | POA: Diagnosis not present

## 2019-12-31 DIAGNOSIS — Z5329 Procedure and treatment not carried out because of patient's decision for other reasons: Secondary | ICD-10-CM | POA: Diagnosis not present

## 2019-12-31 DIAGNOSIS — I2510 Atherosclerotic heart disease of native coronary artery without angina pectoris: Secondary | ICD-10-CM | POA: Diagnosis not present

## 2019-12-31 DIAGNOSIS — R079 Chest pain, unspecified: Secondary | ICD-10-CM | POA: Diagnosis not present

## 2019-12-31 DIAGNOSIS — Z720 Tobacco use: Secondary | ICD-10-CM | POA: Diagnosis not present

## 2019-12-31 DIAGNOSIS — J439 Emphysema, unspecified: Secondary | ICD-10-CM | POA: Diagnosis not present

## 2019-12-31 DIAGNOSIS — Z955 Presence of coronary angioplasty implant and graft: Secondary | ICD-10-CM | POA: Diagnosis not present

## 2019-12-31 DIAGNOSIS — R21 Rash and other nonspecific skin eruption: Secondary | ICD-10-CM | POA: Diagnosis not present

## 2020-01-04 ENCOUNTER — Encounter (HOSPITAL_COMMUNITY): Payer: Self-pay

## 2020-01-04 ENCOUNTER — Emergency Department (HOSPITAL_COMMUNITY): Payer: Medicare HMO

## 2020-01-04 ENCOUNTER — Other Ambulatory Visit: Payer: Self-pay

## 2020-01-04 ENCOUNTER — Observation Stay (HOSPITAL_COMMUNITY)
Admission: EM | Admit: 2020-01-04 | Discharge: 2020-01-05 | Disposition: A | Discharge status: Home / Self Care | Payer: Medicare HMO | Attending: Internal Medicine | Admitting: Internal Medicine

## 2020-01-04 DIAGNOSIS — U071 COVID-19: Secondary | ICD-10-CM | POA: Diagnosis not present

## 2020-01-04 DIAGNOSIS — I10 Essential (primary) hypertension: Secondary | ICD-10-CM | POA: Diagnosis present

## 2020-01-04 DIAGNOSIS — I25110 Atherosclerotic heart disease of native coronary artery with unstable angina pectoris: Secondary | ICD-10-CM | POA: Diagnosis not present

## 2020-01-04 DIAGNOSIS — E785 Hyperlipidemia, unspecified: Secondary | ICD-10-CM | POA: Diagnosis present

## 2020-01-04 DIAGNOSIS — F17200 Nicotine dependence, unspecified, uncomplicated: Secondary | ICD-10-CM | POA: Diagnosis present

## 2020-01-04 DIAGNOSIS — R079 Chest pain, unspecified: Secondary | ICD-10-CM | POA: Diagnosis not present

## 2020-01-04 DIAGNOSIS — Z7982 Long term (current) use of aspirin: Secondary | ICD-10-CM | POA: Insufficient documentation

## 2020-01-04 DIAGNOSIS — I200 Unstable angina: Secondary | ICD-10-CM | POA: Diagnosis not present

## 2020-01-04 DIAGNOSIS — R05 Cough: Secondary | ICD-10-CM | POA: Diagnosis not present

## 2020-01-04 DIAGNOSIS — Z79899 Other long term (current) drug therapy: Secondary | ICD-10-CM | POA: Insufficient documentation

## 2020-01-04 DIAGNOSIS — I119 Hypertensive heart disease without heart failure: Secondary | ICD-10-CM | POA: Insufficient documentation

## 2020-01-04 LAB — TROPONIN I (HIGH SENSITIVITY): Troponin I (High Sensitivity): 10 ng/L (ref <18)

## 2020-01-04 LAB — BASIC METABOLIC PANEL
Anion gap: 11 (ref 5–15)
BUN: 13 mg/dL (ref 8–23)
CO2: 25 mmol/L (ref 22–32)
Calcium: 8.7 mg/dL — ABNORMAL LOW (ref 8.9–10.3)
Chloride: 105 mmol/L (ref 98–111)
Creatinine, Ser: 1.01 mg/dL (ref 0.61–1.24)
GFR calc Af Amer: >60 mL/min (ref >60)
GFR calc non Af Amer: >60 mL/min (ref >60)
Glucose, Bld: 145 mg/dL — ABNORMAL HIGH (ref 70–99)
Potassium: 3.8 mmol/L (ref 3.5–5.1)
Sodium: 141 mmol/L (ref 135–145)

## 2020-01-04 LAB — CBC
HCT: 42.6 % (ref 39.0–52.0)
Hemoglobin: 14.3 g/dL (ref 13.0–17.0)
MCH: 31.9 pg (ref 26.0–34.0)
MCHC: 33.6 g/dL (ref 30.0–36.0)
MCV: 95.1 fL (ref 80.0–100.0)
Platelets: 173 10*3/uL (ref 150–400)
RBC: 4.48 MIL/uL (ref 4.22–5.81)
RDW: 12.8 % (ref 11.5–15.5)
WBC: 6.3 10*3/uL (ref 4.0–10.5)
nRBC: 0 % (ref 0.0–0.2)

## 2020-01-04 NOTE — ED Triage Notes (Signed)
Reports took nitro x1 PTA with some relief of chest pain.

## 2020-01-04 NOTE — ED Triage Notes (Signed)
Pt presents via POV c/o chest pain Saturday. Pt reports seen in ED and dx with MI. Pt reports left AMA. Reports chest pain started this am.

## 2020-01-05 ENCOUNTER — Encounter (HOSPITAL_COMMUNITY): Payer: Self-pay | Admitting: Internal Medicine

## 2020-01-05 ENCOUNTER — Other Ambulatory Visit: Payer: Self-pay | Admitting: Student

## 2020-01-05 DIAGNOSIS — I208 Other forms of angina pectoris: Secondary | ICD-10-CM

## 2020-01-05 DIAGNOSIS — E785 Hyperlipidemia, unspecified: Secondary | ICD-10-CM | POA: Diagnosis not present

## 2020-01-05 DIAGNOSIS — U071 COVID-19: Secondary | ICD-10-CM | POA: Diagnosis not present

## 2020-01-05 DIAGNOSIS — I10 Essential (primary) hypertension: Secondary | ICD-10-CM | POA: Diagnosis present

## 2020-01-05 DIAGNOSIS — I679 Cerebrovascular disease, unspecified: Secondary | ICD-10-CM | POA: Insufficient documentation

## 2020-01-05 DIAGNOSIS — F17200 Nicotine dependence, unspecified, uncomplicated: Secondary | ICD-10-CM

## 2020-01-05 DIAGNOSIS — Z720 Tobacco use: Secondary | ICD-10-CM | POA: Insufficient documentation

## 2020-01-05 DIAGNOSIS — R079 Chest pain, unspecified: Secondary | ICD-10-CM

## 2020-01-05 DIAGNOSIS — I779 Disorder of arteries and arterioles, unspecified: Secondary | ICD-10-CM | POA: Insufficient documentation

## 2020-01-05 DIAGNOSIS — I2510 Atherosclerotic heart disease of native coronary artery without angina pectoris: Secondary | ICD-10-CM | POA: Insufficient documentation

## 2020-01-05 LAB — D-DIMER, QUANTITATIVE: D-Dimer, Quant: 0.71 ug/mL-FEU — ABNORMAL HIGH (ref 0.00–0.50)

## 2020-01-05 LAB — TROPONIN I (HIGH SENSITIVITY)
Troponin I (High Sensitivity): 11 ng/L (ref <18)
Troponin I (High Sensitivity): 12 ng/L (ref <18)

## 2020-01-05 LAB — SARS CORONAVIRUS 2 BY RT PCR (HOSPITAL ORDER, PERFORMED IN ~~LOC~~ HOSPITAL LAB): SARS Coronavirus 2: POSITIVE — AB

## 2020-01-05 LAB — C-REACTIVE PROTEIN: CRP: 1.4 mg/dL — ABNORMAL HIGH (ref <1.0)

## 2020-01-05 LAB — LACTATE DEHYDROGENASE: LDH: 166 U/L (ref 98–192)

## 2020-01-05 LAB — FERRITIN: Ferritin: 238 ng/mL (ref 24–336)

## 2020-01-05 LAB — PROCALCITONIN: Procalcitonin: <0.1 ng/mL

## 2020-01-05 LAB — FIBRINOGEN: Fibrinogen: 539 mg/dL — ABNORMAL HIGH (ref 210–475)

## 2020-01-05 MED ORDER — ONDANSETRON HCL 4 MG/2ML IJ SOLN: 4.0000 mg | Freq: Four times a day (QID) | INTRAMUSCULAR | Status: DC | PRN

## 2020-01-05 MED ORDER — CITALOPRAM HYDROBROMIDE 10 MG PO TABS
40.0000 mg | ORAL_TABLET | Freq: Every day | ORAL | Status: DC
  Filled 2020-01-05: qty 4

## 2020-01-05 MED ORDER — ENOXAPARIN SODIUM 40 MG/0.4ML ~~LOC~~ SOLN
40.0000 mg | SUBCUTANEOUS | Status: DC
  Administered 2020-01-05: 40 mg via SUBCUTANEOUS
  Filled 2020-01-05: qty 0.4

## 2020-01-05 MED ORDER — EPINEPHRINE 0.3 MG/0.3ML IJ SOAJ: 0.3000 mg | Freq: Once | INTRAMUSCULAR | Status: DC | PRN

## 2020-01-05 MED ORDER — ACETAMINOPHEN 325 MG PO TABS: 650.0000 mg | ORAL_TABLET | ORAL | Status: DC | PRN

## 2020-01-05 MED ORDER — SODIUM CHLORIDE 0.9 % IV SOLN
1200.0000 mg | Freq: Once | INTRAVENOUS | Status: DC
  Filled 2020-01-05: qty 10

## 2020-01-05 MED ORDER — METOPROLOL SUCCINATE ER 25 MG PO TB24
25.0000 mg | ORAL_TABLET | Freq: Every day | ORAL | Status: DC
  Filled 2020-01-05: qty 1

## 2020-01-05 MED ORDER — METHYLPREDNISOLONE SODIUM SUCC 125 MG IJ SOLR: 125.0000 mg | Freq: Once | INTRAMUSCULAR | Status: DC | PRN

## 2020-01-05 MED ORDER — ALBUTEROL SULFATE HFA 108 (90 BASE) MCG/ACT IN AERS: 2.0000 | INHALATION_SPRAY | Freq: Once | RESPIRATORY_TRACT | Status: DC | PRN

## 2020-01-05 MED ORDER — FAMOTIDINE IN NACL 20-0.9 MG/50ML-% IV SOLN: 20.0000 mg | Freq: Once | INTRAVENOUS | Status: DC | PRN

## 2020-01-05 MED ORDER — SODIUM CHLORIDE 0.9% FLUSH
3.0000 mL | Freq: Two times a day (BID) | INTRAVENOUS | Status: DC
  Administered 2020-01-05: 3 mL via INTRAVENOUS

## 2020-01-05 MED ORDER — SODIUM CHLORIDE 0.9 % IV SOLN: INTRAVENOUS | Status: DC | PRN

## 2020-01-05 MED ORDER — OXYCODONE HCL 5 MG PO TABS: 10.0000 mg | ORAL_TABLET | Freq: Four times a day (QID) | ORAL | Status: DC | PRN

## 2020-01-05 MED ORDER — ASPIRIN EC 81 MG PO TBEC
81.0000 mg | DELAYED_RELEASE_TABLET | Freq: Every day | ORAL | Status: DC
  Filled 2020-01-05: qty 1

## 2020-01-05 MED ORDER — DIPHENHYDRAMINE HCL 50 MG/ML IJ SOLN: 50.0000 mg | Freq: Once | INTRAMUSCULAR | Status: DC | PRN

## 2020-01-05 MED ORDER — ATORVASTATIN CALCIUM 40 MG PO TABS
40.0000 mg | ORAL_TABLET | Freq: Every day | ORAL | Status: DC
  Filled 2020-01-05: qty 1

## 2020-01-05 NOTE — H&P (Signed)
History and Physical    Edward Brown LZJ:[id number] DOB: 06/06/1950 DOA: 01/04/2020  PCP: Redmond School, MD Consultants:  Scot Dock - vascular Patient coming from:  Home - lives with son; NOK: [place], [place], [phone]  Chief Complaint: Chest pain  HPI: Edward Brown is a 69 y.o. male with medical history significant of CVA; PVD s/p B CEA; CAD s/p stent; HTN; and HLD presenting with chest pain.  Last weekend, his chest was hurting and he was gasping for breath with nausea.  He felt antsy.  The pain was left-sided and was non-exertional.  He took a NTG and went to [facility].  The area continued to ache.  He went home and slept about 27 hours.  When he woke up, he continued to feel antsy.  He can feel a little pressure but not pain now.  The pain did improve with NTG.   He has continued to feel weak and tired.    ED Course:  H/o CAD, stent 2007.  Intermittent CP x days, worse with exertion, relieved with NTG.  Went to [facility] recently, suggested stress test but he went home.  Continued having symptoms and decided to come here instead.  EKG ok, troponin ok.  Likely needs stress test.  Review of Systems: As per HPI; otherwise review of systems reviewed and negative.   Ambulatory Status:  Ambulates without assistance  COVID Vaccine Status:  None  Past Medical History:  Diagnosis Date  . CAD (coronary artery disease)    [place] Vessel, Severe single-vessel coronary artery disease. a. status post aborted inferior myocardial infarction, followed by a bare-metal stenting of subtotal proximal RCA, January 2007. b. Residual nonobructive coronary artery disease. c. EF 50-55%; inferobasal akineses  . Cerebrovascular disease    a. Status post prior bilateral carotid endarterectomy, with residual bilateral bruits. b. 50-69% right ICA stenosis, February 2009. c Bilateral, proximal ECA occlusion with distal collateralization  . Dyslipidemia   . Hypertension    unspecified  . Tobacco abuse     Past  Surgical History:  Procedure Laterality Date  . ARCH AORTOGRAM N/A 09/20/2012   Procedure: ARCH AORTOGRAM;  Surgeon: Angelia Mould, MD;  Location: [facility] CATH LAB;  Service: Cardiovascular;  Laterality: N/A;  . CAROTID ENDARTERECTOMY     Redo; right carotid endarterectomy with replacement of carotid artery with interpostion greater saphenous vein from the left leg  . SPINE SURGERY  Aug. 2012    Social History   Socioeconomic History  . Marital status: Widowed    Spouse name: Not on file  . Number of children: Not on file  . Years of education: Not on file  . Highest education level: Not on file  Occupational History  . Occupation: retired  Tobacco Use  . Smoking status: Former Smoker    Packs/day: 1.50    Years: 40.00    Pack years: 60.00    Types: Cigarettes    Quit date: 01/03/2020  . Smokeless tobacco: Never Used  Substance and Sexual Activity  . Alcohol use: Yes    Alcohol/week: 0.0 standard drinks    Comment: rare  . Drug use: No  . Sexual activity: Not on file  Other Topics Concern  . Not on file  Social History Narrative  . Not on file   Social Determinants of Health   Financial Resource Strain:   . Difficulty of Paying Living Expenses: Not on file  Food Insecurity:   . Worried About Charity fundraiser in the Last  Year: Not on file  . Ran Out of Food in the Last Year: Not on file  Transportation Needs:   . Lack of Transportation (Medical): Not on file  . Lack of Transportation (Non-Medical): Not on file  Physical Activity:   . Days of Exercise per Week: Not on file  . Minutes of Exercise per Session: Not on file  Stress:   . Feeling of Stress : Not on file  Social Connections:   . Frequency of Communication with Friends and Family: Not on file  . Frequency of Social Gatherings with Friends and Family: Not on file  . Attends Religious Services: Not on file  . Active Member of Clubs or Organizations: Not on file  . Attends Archivist  Meetings: Not on file  . Marital Status: Not on file  Intimate Partner Violence:   . Fear of Current or Ex-Partner: Not on file  . Emotionally Abused: Not on file  . Physically Abused: Not on file  . Sexually Abused: Not on file    Allergies  Allergen Reactions  . Codeine Other (See Comments)    REACTION: stomach upset  . Hydromorphone Hcl Other (See Comments)    REACTION: violent behavior  . Hydromorphone Hcl Other (See Comments)    REACTION: violent behavior  . Promethazine Hcl     REACTION: violent behaviors  . Promethazine Hcl     REACTION: violent behaviors    Family History  Problem Relation Age of Onset  . Dementia Mother   . Heart disease Father        Heart Disease before age 24  . Heart attack Father   . Coronary artery disease Other     Prior to Admission medications   Medication Sig Start Date End Date Taking? Authorizing Provider  aspirin EC 81 MG tablet Take 1 tablet (81 mg total) by mouth daily. 07/28/12   Serpe, Burna Forts, PA-C  atorvastatin (LIPITOR) 80 MG tablet  02/20/16   [provider]  cetirizine (ZYRTEC) 10 MG tablet Take 10 mg by mouth daily.    [provider]  citalopram (CELEXA) 40 MG tablet Take 1 tablet (40 mg total) by mouth daily. 03/03/11 04/18/15  Ezra Sites, MD  isosorbide mononitrate (IMDUR) 30 MG 24 hr tablet Take 1 tablet (30 mg total) by mouth daily. Patient not taking: Reported on 04/02/2016 02/16/15   Herminio Commons, MD  lisinopril (PRINIVIL,ZESTRIL) 10 MG tablet Take 1 tablet (10 mg total) by mouth daily. 04/18/15   Herminio Commons, MD  metoprolol succinate (TOPROL XL) 25 MG 24 hr tablet Take 0.5 tablets (12.5 mg total) by mouth daily. 04/18/15   Herminio Commons, MD  nitroGLYCERIN (NITROSTAT) 0.4 MG SL tablet Place 1 tablet (0.4 mg total) under the tongue every 5 (five) minutes as needed for chest pain. 02/16/15 02/16/16  Herminio Commons, MD    Physical Exam: Vitals:   01/05/20 1015  01/05/20 1030 01/05/20 1045 01/05/20 1100  BP: (!) 157/66 (!) 85/51 107/74 (!) 158/72  Pulse: (!) 44 (!) 49 (!) 48 (!) 45  Resp: 15 18 16 16   Temp:      TempSrc:      SpO2: 95% 100% 96% 98%  Weight:      Height:         . General:  Appears calm and comfortable and is NAD . Eyes:  PERRL, EOMI, normal lids, iris . ENT:  grossly normal hearing, lips & tongue, mmm .  Neck:  no LAD, masses or thyromegaly . Cardiovascular:  RRR, no m/r/g. No LE edema.  Marland Kitchen Respiratory:   CTA bilaterally with no wheezes/rales/rhonchi.  Normal respiratory effort. . Abdomen:  soft, NT, ND, NABS . Skin:  no rash or induration seen on limited exam . Musculoskeletal:  grossly normal tone BUE/BLE, good ROM, no bony abnormality . Psychiatric:  grossly normal mood and affect, speech fluent and appropriate, AOx3 . Neurologic:  CN 2-12 grossly intact, moves all extremities in coordinated fashion    Radiological Exams on Admission: DG Chest 2 View  Result Date: 01/04/2020 CLINICAL DATA:  Chest pain and productive cough x1 day. EXAM: CHEST - 2 VIEW COMPARISON:  December 31, 2019 FINDINGS: There is no evidence of acute infiltrate, pleural effusion or pneumothorax. The heart size and mediastinal contours are within normal limits. The visualized skeletal structures are unremarkable. IMPRESSION: No active cardiopulmonary disease. Electronically Signed   By: Virgina Norfolk M.D.   On: 01/04/2020 20:24    EKG: Independently reviewed.  NSR with rate 75; nonspecific ST changes with no evidence of acute ischemia   Labs on Admission: I have personally reviewed the available labs and imaging studies at the time of the admission.  Pertinent labs:   Glucose 145 HS troponin 10, 11 Normal CBC   Assessment/Plan Principal Problem:   Chest pain Active Problems:   TOBACCO ABUSE   Hypertension   Dyslipidemia   COVID-19 virus infection    Chest pain -Patient with left-sided chest pressure that started several days ago,  mild aching pain present at this time, non-exertional and relieved with NTG. -1-2/3 typical symptoms suggestive of noncardiac vs. atypical chest pain.  -CXR unremarkable.   -Cardiac HS troponin negative x 3.  -EKG not indicative of acute ischemia.   -HEART pathway score is 6, indicating that the patient has an elevated risk score and requires further evaluation. -Will plan to place in observation status on telemetry to rule out ACS by overnight observation.  -Continue ASA 81 mg daily -Risk factor stratification with HgbA1c and FLP; will also check TSH and UDS -Cardiology consultation  -Given his COVID+ status, he likely will need further ischemic testing as an outpatient once he is no longer acutely infected  COVID-19 infection -Patient with presenting with SOB, weakness, and cough which he attributed to angina; certainly this could be the cause, but it is difficult to say that COVID is not playing a role -He does not have a current O2 requirement -COVID POSITIVE -The patient has comorbidities which may increase the risk for ARDS/MODS including: age, HTN, DM, COPD -Pertinent labs concerning for COVID include normal WBC count; elevated D-dimer (not >1);  increased fibrinogen -CXR unremarkable -Will observe overnight for further evaluation, close monitoring, and treatment -Monitor on telemetry x at least 24 hours -At this time, will attempt to avoid use of aerosolized medications and use HFAs instead -Will check daily labs including BMP with Mag, Phos; LFTs; CBC with differential; CRP; ferritin; fibrinogen; D-dimer -He was discussed with pharmacy and given the mild nature of his symptoms he qualifies for monoclonal antibody treatment; will treat with this and monitor overnight with possible plan for d/c tomorrow if he continues to do well -Patient was seen wearing full PPE including: gown, gloves, head cover, N95, and face shield; donning and doffing was in compliance with current  standards.  HTN -Hold Lisinopril, continue Toprol  HLD -Continue Lipitor -Check lipids  Tobacco dependence -He reports that he quit 3 days ago -Ongoing cessation  encouraged -Declines nicotine patch  Pain -I have reviewed this patient in the [place] Controlled Substances Reporting System.  He is receiving medications from only one provider and receives only periodic prescriptions -He is not at particularly high risk of opioid misuse, diversion, or overdose. -Will continue prn Oxycodone if needed for pain.    DVT prophylaxis: Lovenox  Code Status:  Full - confirmed with patient Family Communication: None present Disposition Plan:  The patient is from: home  Anticipated d/c is to: home without [facility] services  Anticipated d/c date will depend on clinical response to treatment, but possibly as early as tomorrow if he has excellent response to treatment  Patient is currently: acutely ill Consults called: Cardiology  Admission status: It is my clinical opinion that referral for OBSERVATION is reasonable and necessary in this patient based on the above information provided. The aforementioned taken together are felt to place the patient at high risk for further clinical deterioration. However it is anticipated that the patient may be medically stable for discharge from the hospital within 24 to 48 hours.      Karmen Bongo MD [facility]   How to contact the [facility] Attending or Consulting provider [place] or covering provider during after hours [place], for this patient?  1. Check the care team in [facility] and look for a) attending/consulting [facility] provider listed and b) the [facility] team listed 2. Log into [email or web address] and use [place]'s universal password to access. If you do not have the password, please contact the hospital operator. 3. Locate the [facility] provider you are looking for under [facility] and page to a number that you can be directly reached. 4. If you still have difficulty  reaching the provider, please page the [facility] (Director on Call) for the Hospitalists listed on amion for assistance.   01/05/2020, 12:08 PM

## 2020-01-05 NOTE — Consult Note (Addendum)
Cardiology Consultation:   Patient ID: Edward Brown MRN: [id number]; DOB: 07-13-1950  Admit date: 01/04/2020 Date of Consult: 01/05/2020  Primary Care Provider: Redmond School, MD [facility] Cardiologist: New to [facility]. Previously seen by Dr. Bronson Ing in 2016. [facility] Electrophysiologist:  None    Patient Profile:   Edward Brown is a 69 y.o. male with a history of CAD s/p BMS to proximal RCA in 2007 with residual non-obstructive disease, PAD with bilateral carotid artery stenosis s/p bilateral endarterectomy, hypertension, dyslipidemia, tobacco abuse who is being seen today for the evaluation of chest pain in setting of COVID-19 infection at the request of Dr. Lorin Mercy.  History of Present Illness:   Edward Brown is a 69 year old male with the above history who was previously seen by Dr. Bronson Ing but has not been seen since 2016. He has a history of CAD with NSTEMI in 2007. Cardiac catheterization at that time showed 99% stenosis of proximal RCA with otherwise non-obstructive disease. He was treated with PTCA and BMS to RCA lesion. Does not look like he has had any additional ischemic evaluation since that time. Last Echo in 2016 showed LVEF of 60-65% with normal wall motion. He also has known bilateral carotid artery disease and previously underwent bilateral CEA. Most recent carotid ultrasound in 12/2018 showed stable, chronic complete occlusion of the left common, internal, and external carotid arteries and stable moderate (50-60%) stenosis of the right ICA secondary to smooth heterogeneous atherosclerotic plaque.  Patient presented to the [facility] ED on 12/31/2019 for further evaluation of exertional chest pain that improved with rest and Nitro and associated shortness of breath, nausea, and sweating. Patient reported pain felt similar to prior angina. EKG reportedly showed non-specific ST/T changes. High-sensitivity troponin negative x3. Symptoms concerning for unstable  angina and admission was recommended so stress test could be completed but patient left AMA.   He presented to the [facility] ED on 01/04/2020 for recurrent chest pain.  In the ED, BP labile. O2 sats >90% on room air. EKG showed normal sinus rhythm, rate 75 bpm, with bigeminy PAC, left axis deviation, minimal ST depression in V4-V6. High-sensitivity troponin negative x3. Chest x-ray showed no acute findings. WBC 6.3, Hgb 14.3, Plts 173. Na 141, K 3.3, Glucose 145, BUN 13, 1.01. COVID-19 positive. Patient admitted and Cardiology consulted for further evaluation.  Given COVID positive status, I called into patient's room to obtain history. Patient reports he was in his usual state of health until 12/31/2019 when he was awaked from sleep with non-radiating substernal chest pain with associated shortness of breath, dizziness, and nausea. He also reports feeling very antsy. He ranks the pain as a 4-5/10 on the pain scale and states it persisted until he took sublingual Nitro. That is when patient decided to go to the [facility] ED. He has continued to have intermittent chest pressure since leaving the ED. It occurs at rest. He actually denies it being worse with exertion to me. No chest pain prior to 12/31/2019. No orthopnea or PND. He swelling of his left foot last week but this as since resolved. No other edema. No palpitations, dizziness (other than episode on 12/31/2019), or syncope. He denies any recent fevers or chills but has felt more weak and fatigued lately. He denies any respiratory symptoms. No known exposure to COVID but his is unvaccinated. No abnormal bleeding in urine or stools.   He recently quit smoking about 1 week ago. Prior to this he was  smoking 1.5 packs per day.   Past Medical History:  Diagnosis Date  . CAD (coronary artery disease)    Native Vessel, Severe single-vessel coronary artery disease. a. status post aborted inferior myocardial infarction, followed by a bare-metal stenting of  subtotal proximal RCA, January 2007. b. Residual nonobructive coronary artery disease. c. EF 50-55%; inferobasal akineses  . Cerebrovascular disease    a. Status post prior bilateral carotid endarterectomy, with residual bilateral bruits. b. 50-69% right ICA stenosis, February 2009. c Bilateral, proximal ECA occlusion with distal collateralization  . Dyslipidemia   . Hypertension    unspecified  . Tobacco abuse     Past Surgical History:  Procedure Laterality Date  . ARCH AORTOGRAM N/A 09/20/2012   Procedure: ARCH AORTOGRAM;  Surgeon: Angelia Mould, MD;  Location: [facility] CATH LAB;  Service: Cardiovascular;  Laterality: N/A;  . CAROTID ENDARTERECTOMY     Redo; right carotid endarterectomy with replacement of carotid artery with interpostion greater saphenous vein from the left leg  . SPINE SURGERY  Aug. 2012     Home Medications:  Prior to Admission medications   Medication Sig Start Date End Date Taking? Authorizing Provider  aspirin EC 81 MG tablet Take 1 tablet (81 mg total) by mouth daily. 07/28/12  Yes Serpe, Burna Forts, PA-C  atorvastatin (LIPITOR) 40 MG tablet Take 40 mg by mouth daily.  02/20/16  Yes [provider]  citalopram (CELEXA) 40 MG tablet Take 1 tablet (40 mg total) by mouth daily. 03/03/11 01/05/20 Yes de Stanford Scotland, MD  lisinopril (PRINIVIL,ZESTRIL) 10 MG tablet Take 1 tablet (10 mg total) by mouth daily. 04/18/15  Yes Herminio Commons, MD  metoprolol succinate (TOPROL XL) 25 MG 24 hr tablet Take 0.5 tablets (12.5 mg total) by mouth daily. Patient taking differently: Take 25 mg by mouth daily.  04/18/15  Yes Herminio Commons, MD  nitroGLYCERIN (NITROSTAT) 0.4 MG SL tablet Place 1 tablet (0.4 mg total) under the tongue every 5 (five) minutes as needed for chest pain. 02/16/15 01/05/20 Yes Herminio Commons, MD  Oxycodone HCl 10 MG TABS Take 10 mg by mouth 4 (four) times daily as needed (pain).  12/08/19  Yes [provider]  isosorbide  mononitrate (IMDUR) 30 MG 24 hr tablet Take 1 tablet (30 mg total) by mouth daily. Patient not taking: Reported on 01/05/2020 02/16/15   Herminio Commons, MD    Inpatient Medications: Scheduled Meds: . enoxaparin (LOVENOX) injection  40 mg Subcutaneous Q24H  . sodium chloride flush  3 mL Intravenous Q12H   Continuous Infusions:  PRN Meds: acetaminophen, ondansetron (ZOFRAN) IV  Allergies:    Allergies  Allergen Reactions  . Codeine Other (See Comments)    REACTION: stomach upset  . Hydromorphone Hcl Other (See Comments)    REACTION: violent behavior  . Hydromorphone Hcl Other (See Comments)    REACTION: violent behavior  . Promethazine Hcl     REACTION: violent behaviors  . Promethazine Hcl     REACTION: violent behaviors    Social History:   Social History   Socioeconomic History  . Marital status: Widowed    Spouse name: Not on file  . Number of children: Not on file  . Years of education: Not on file  . Highest education level: Not on file  Occupational History  . Occupation: retired  Tobacco Use  . Smoking status: Former Smoker    Packs/day: 1.50    Years: 40.00    Pack years: 60.00  Types: Cigarettes    Quit date: 01/03/2020  . Smokeless tobacco: Never Used  Substance and Sexual Activity  . Alcohol use: Yes    Alcohol/week: 0.0 standard drinks    Comment: rare  . Drug use: No  . Sexual activity: Not on file  Other Topics Concern  . Not on file  Social History Narrative  . Not on file   Social Determinants of Health   Financial Resource Strain:   . Difficulty of Paying Living Expenses: Not on file  Food Insecurity:   . Worried About Charity fundraiser in the Last Year: Not on file  . Ran Out of Food in the Last Year: Not on file  Transportation Needs:   . Lack of Transportation (Medical): Not on file  . Lack of Transportation (Non-Medical): Not on file  Physical Activity:   . Days of Exercise per Week: Not on file  . Minutes of Exercise  per Session: Not on file  Stress:   . Feeling of Stress : Not on file  Social Connections:   . Frequency of Communication with Friends and Family: Not on file  . Frequency of Social Gatherings with Friends and Family: Not on file  . Attends Religious Services: Not on file  . Active Member of Clubs or Organizations: Not on file  . Attends Archivist Meetings: Not on file  . Marital Status: Not on file  Intimate Partner Violence:   . Fear of Current or Ex-Partner: Not on file  . Emotionally Abused: Not on file  . Physically Abused: Not on file  . Sexually Abused: Not on file    Family History:    Family History  Problem Relation Age of Onset  . Dementia Mother   . Heart disease Father        Heart Disease before age 64  . Heart attack Father   . Coronary artery disease Other      ROS:  Please see the history of present illness.  Review of Systems  Constitutional: Positive for malaise/fatigue. Negative for chills and fever.  HENT: Negative for congestion.   Respiratory: Positive for shortness of breath. Negative for cough.   Cardiovascular: Positive for chest pain. Negative for palpitations, orthopnea, leg swelling and PND.  Gastrointestinal: Positive for nausea. Negative for blood in stool, melena and vomiting.  Genitourinary: Negative for hematuria.  Musculoskeletal: Negative for myalgias.  Neurological: Positive for dizziness and weakness. Negative for loss of consciousness.  Endo/Heme/Allergies: Does not bruise/bleed easily.  Psychiatric/Behavioral: Negative for substance abuse (tobacco use - quit 1 week).     Physical Exam/Data:   Vitals:   01/05/20 1200 01/05/20 1230 01/05/20 1315 01/05/20 1400  BP: 119/76 130/64 114/60 (!) 93/53  Pulse: 80 79 (!) 32 70  Resp: 19 (!) 24 (!) 23 (!) 21  Temp:      TempSrc:      SpO2: 97% 97% 94% 96%  Weight:      Height:       No intake or output data in the 24 hours ending 01/05/20 1516 Last 3 Weights 01/05/2020  04/02/2016 04/18/2015  Weight (lbs) 176 lb 170 lb 180 lb  Weight (kg) 79.833 kg 77.111 kg 81.647 kg     Body mass index is 26.76 kg/m.   Given COVID positive status, I called into patient's room to obtain history and did not physically examine patient. Therefore, exam limited:  General: 69 y.o. male in no acute distress. Heart: RRR with ectopy per  telemetry.  Lungs: Able to talk in complete sentences with no labored breathing. No coughing or audible wheezing. Neuro: Alert and oriented x3.  Psych: Normal affect. Responds appropriately.  MD to follow with complete exam.  EKG:  The EKG was personally reviewed and demonstrates:  Normal sinus rhythm, rate 75 bpm, with bigeminy PAC, left axis deviation, minimal ST depression in V4-V6.  Telemetry:  Telemetry was personally reviewed and demonstrates:  Normal sinus rhythm with PACs. Rates in the 60's to 70's.  Relevant CV Studies:  Echocardiogram 01/04/2020: Study Conclusions: - Left ventricle: The cavity size was normal. Systolic function was  normal. The estimated ejection fraction was in the range of 60%  to 65%. Wall motion was normal; there were no regional wall  motion abnormalities.  - Left atrium: The atrium was mildly dilated.  - Atrial septum: No defect or patent foramen ovale was identified.  _______________  Carotid Ultrasounds 12/24/2018: Impressions: 1. Stable chronic complete occlusion of the left common, internal and external carotid arteries. 2. Stable possible moderate (50-69%) stenosis of the right internal carotid artery secondary to smooth heterogeneous atherosclerotic plaque. This may be an over estimation of the stenosis due to compensatory increased flow in the setting of a contralateral occlusion. 3. The vertebral arteries are patent with normal antegrade flow.  Laboratory Data:  High Sensitivity Troponin:   Recent Labs  Lab 01/04/20 1952 01/04/20 2204 01/05/20 0845  TROPONINIHS 10 11 12        Chemistry Recent Labs  Lab 01/04/20 1952  NA 141  K 3.8  CL 105  CO2 25  GLUCOSE 145*  BUN 13  CREATININE 1.01  CALCIUM 8.7*  GFRNONAA >60  GFRAA >60  ANIONGAP 11    No results for input(s): PROT, ALBUMIN, AST, ALT, ALKPHOS, BILITOT in the last 168 hours. Hematology Recent Labs  Lab 01/04/20 1952  WBC 6.3  RBC 4.48  HGB 14.3  HCT 42.6  MCV 95.1  MCH 31.9  MCHC 33.6  RDW 12.8  PLT 173   BNPNo results for input(s): BNP, PROBNP in the last 168 hours.  DDimer  Recent Labs  Lab 01/05/20 1122  DDIMER 0.71*     Radiology/Studies:  DG Chest 2 View  Result Date: 01/04/2020 CLINICAL DATA:  Chest pain and productive cough x1 day. EXAM: CHEST - 2 VIEW COMPARISON:  December 31, 2019 FINDINGS: There is no evidence of acute infiltrate, pleural effusion or pneumothorax. The heart size and mediastinal contours are within normal limits. The visualized skeletal structures are unremarkable. IMPRESSION: No active cardiopulmonary disease. Electronically Signed   By: Virgina Norfolk M.D.   On: 01/04/2020 20:24   HEAR Score (for undifferentiated chest pain):  HEAR Score: 7     Assessment and Plan:   Chest Pain  - Patient presented with chest pain at rest that improves with Nitro.  - EKG showed normal sinus rhythm with ST depression in leads V4-V6. - High-sensitivity troponin negative x3.  - Will check Echo. - Currently chest pain free. - Continue aspirin and high-intensity statin. BP soft so will hold beta-blocker. Imdur listed as PTA medication but patient states he is not taking this. Will hold on this as well given soft BP. - If EF normal and no wall motion abnormalities, can get Myoview once patient recovers from [place].  COVID-19 Infection - COVID-19 testing positive. No hypoxia. No significant respiratory symptoms. He had some associated shortness of with chest pain episode.  - D-dimer elevated at 0.71. CRP elevated at 1.4. Fibrinogen  elevated at 539. Ferritin and LDH  normal. - Management per primary team.  PAD with Bilateral Carotid Stenosis - S/p bilateral CEA. - Most recent carotid ultrasound showed stable, chronic complete occlusion of the left common, internal, and external carotid arteries and stable moderate (50-60%) stenosis of the right ICA secondary to smooth heterogeneous atherosclerotic plaque.  - Continue aspirin and high-intensity statin.   Hypertension - History of hypertension but BP has been labile in the ED. Systolic BP ranging from 85 to 158. Most recent BP 93/53. - Hold home Toprol and Lisinopril given soft BP.  - If BP stabilizes, would add Toprol back before Lisinopril.  Dyslipidemia - Will check fasting lipid panel tomorrow morning. - Continue Lipitor 40mg  daily for now.  Tobacco Abuse - Patient quit smoking last week. He was smoking 1.5 packs per day before then.    For questions or updates, please contact [place] Please consult [email or web address] for contact info under    Signed, Darreld Mclean, PA-C  01/05/2020 3:16 PM

## 2020-01-05 NOTE — ED Notes (Signed)
Pt given water & OJ, urinal at bedside. Pt also placed on hospital bed.

## 2020-01-05 NOTE — Progress Notes (Signed)
Ordered outpatient Lexiscan Myoview for further evaluation of chest pain. Please see consult note from today for more information.  Darreld Mclean, PA-C 01/05/2020 3:34 PM

## 2020-01-05 NOTE — ED Provider Notes (Signed)
[place] EMERGENCY DEPARTMENT Provider Note  CSN: [id number] Arrival date & time: 01/04/20 1940    History Chief Complaint  Patient presents with  . Chest Pain    HPI  Edward Brown is a 69 y.o. male with history of CAD/PVD with coronary stent in 2007 and Carotid stent in 2009 reports several days of intermittent pressure/squeezing chest pain, associated with SOB and worse with exertion. Relieved with NTG. He was seen at the ED in [place], [place] 3-4 days ago and had a negative workup there, recommended admission for stress testing but left AMA. Continued to have pain so came to this ED last night. No pain at present.    Past Medical History:  Diagnosis Date  . CAD (coronary artery disease)    Native Vessel, Severe single-vessel coronary artery disease. a. status post aborted inferior myocardial infarction, followed by a bare-metal stenting of subtotal proximal RCA, January 2007. b. Residual nonobructive coronary artery disease. c. EF 50-55%; inferobasal akineses  . Cerebrovascular disease    a. Status post prior bilateral carotid endarterectomy, with residual bilateral bruits. b. 50-69% right ICA stenosis, February 2009. c Bilateral, proximal ECA occlusion with distal collateralization  . Dyslipidemia   . Hypertension    unspecified  . Tobacco abuse     Past Surgical History:  Procedure Laterality Date  . ARCH AORTOGRAM N/A 09/20/2012   Procedure: ARCH AORTOGRAM;  Surgeon: Angelia Mould, MD;  Location: [facility] CATH LAB;  Service: Cardiovascular;  Laterality: N/A;  . CAROTID ENDARTERECTOMY     Redo; right carotid endarterectomy with replacement of carotid artery with interpostion greater saphenous vein from the left leg  . SPINE SURGERY  Aug. 2012    Family History  Problem Relation Age of Onset  . Dementia Mother   . Heart disease Father        Heart Disease before age 40  . Heart attack Father   . Coronary artery disease Other     Social History   Tobacco Use  .  Smoking status: Current Every Day Smoker    Packs/day: 1.50    Years: 40.00    Pack years: 60.00    Types: Cigarettes  . Smokeless tobacco: Never Used  Substance Use Topics  . Alcohol use: No    Alcohol/week: 0.0 standard drinks  . Drug use: No     Home Medications Prior to Admission medications   Medication Sig Start Date End Date Taking? Authorizing Provider  aspirin EC 81 MG tablet Take 1 tablet (81 mg total) by mouth daily. 07/28/12   Serpe, Burna Forts, PA-C  atorvastatin (LIPITOR) 80 MG tablet  02/20/16   [provider]  cetirizine (ZYRTEC) 10 MG tablet Take 10 mg by mouth daily.    [provider]  citalopram (CELEXA) 40 MG tablet Take 1 tablet (40 mg total) by mouth daily. 03/03/11 04/18/15  Ezra Sites, MD  isosorbide mononitrate (IMDUR) 30 MG 24 hr tablet Take 1 tablet (30 mg total) by mouth daily. Patient not taking: Reported on 04/02/2016 02/16/15   Herminio Commons, MD  lisinopril (PRINIVIL,ZESTRIL) 10 MG tablet Take 1 tablet (10 mg total) by mouth daily. 04/18/15   Herminio Commons, MD  metoprolol succinate (TOPROL XL) 25 MG 24 hr tablet Take 0.5 tablets (12.5 mg total) by mouth daily. 04/18/15   Herminio Commons, MD  nitroGLYCERIN (NITROSTAT) 0.4 MG SL tablet Place 1 tablet (0.4 mg total) under the tongue every 5 (five) minutes as needed  for chest pain. 02/16/15 02/16/16  Herminio Commons, MD     Allergies    Codeine, Hydromorphone hcl, and Promethazine hcl   Review of Systems   Review of Systems A comprehensive review of systems was completed and negative except as noted in HPI.    Physical Exam BP (!) 156/81 (BP Location: Right Arm)   Pulse 77   Temp 98.8 F (37.1 C) (Oral)   Resp 18   Ht 5\' 8"  (1.727 m)   Wt 79.8 kg   SpO2 93%   BMI 26.76 kg/m   Physical Exam Vitals and nursing note reviewed.  Constitutional:      Appearance: Normal appearance.  HENT:     Head: Normocephalic and atraumatic.     Nose: Nose  normal.     Mouth/Throat:     Mouth: Mucous membranes are moist.  Eyes:     Extraocular Movements: Extraocular movements intact.     Conjunctiva/sclera: Conjunctivae normal.  Cardiovascular:     Rate and Rhythm: Normal rate.  Pulmonary:     Effort: Pulmonary effort is normal.     Breath sounds: Normal breath sounds.  Abdominal:     General: Abdomen is flat.     Palpations: Abdomen is soft.     Tenderness: There is no abdominal tenderness.  Musculoskeletal:        General: No swelling. Normal range of motion.     Cervical back: Neck supple.  Skin:    General: Skin is warm and dry.  Neurological:     General: No focal deficit present.     Mental Status: He is alert.  Psychiatric:        Mood and Affect: Mood normal.      ED Results / Procedures / Treatments   Labs (all labs ordered are listed, but only abnormal results are displayed) Labs Reviewed  BASIC METABOLIC PANEL - Abnormal; Notable for the following components:      Result Value   Glucose, Bld 145 (*)    Calcium 8.7 (*)    All other components within normal limits  SARS CORONAVIRUS 2 BY RT PCR (HOSPITAL ORDER, [facility])  CBC  TROPONIN I (HIGH SENSITIVITY)  TROPONIN I (HIGH SENSITIVITY)  TROPONIN I (HIGH SENSITIVITY)    EKG EKG Interpretation  Date/Time:  Wednesday January 04 2020 19:46:14 EDT Ventricular Rate:  75 PR Interval:  122 QRS Duration: 100 QT Interval:  408 QTC Calculation: 455 R Axis:   -66 Text Interpretation: Sinus rhythm with Premature atrial complexes in a pattern of bigeminy Left axis deviation Nonspecific ST and T wave abnormality Abnormal ECG No significant change since last tracing Confirmed by Calvert Cantor [phone]) on 01/05/2020 7:35:41 AM   Radiology DG Chest 2 View  Result Date: 01/04/2020 CLINICAL DATA:  Chest pain and productive cough x1 day. EXAM: CHEST - 2 VIEW COMPARISON:  December 31, 2019 FINDINGS: There is no evidence of acute infiltrate,  pleural effusion or pneumothorax. The heart size and mediastinal contours are within normal limits. The visualized skeletal structures are unremarkable. IMPRESSION: No active cardiopulmonary disease. Electronically Signed   By: Virgina Norfolk M.D.   On: 01/04/2020 20:24    Procedures Procedures  Medications Ordered in the ED Medications - No data to display   MDM Rules/Calculators/A&P MDM Patient with history of CAD, having anginal pain over the last few days. EKG and Trop neg for signs of acute ischemia. Agree patient would benefit from stress test. Will  add a third Trop and consult Hospitalist.  ED Course  I have reviewed the triage vital signs and the nursing notes.  Pertinent labs & imaging results that were available during my care of the patient were reviewed by me and considered in my medical decision making (see chart for details).  Clinical Course as of Jan 05 843  Thu Jan 05, 2020  0843 Spoke with Dr. Lorin Mercy, Hospitalist who will evaluate the patient for admission.    [CS]    Clinical Course User Index [CS] Truddie Hidden, MD    Final Clinical Impression(s) / ED Diagnoses Final diagnoses:  Unstable angina [facility])    Rx / DC Orders ED Discharge Orders    None       Truddie Hidden, MD 01/05/20 [phone]

## 2020-01-17 ENCOUNTER — Telehealth: Payer: Self-pay | Admitting: Cardiology

## 2020-01-17 NOTE — Telephone Encounter (Signed)
    Called pt 3x no response. Request also forwarded to [facility] office  Sarajane Jews, Callie E, PA-C  P Cv Div Nl Scheduling This patient needs an outpatient Lexiscan Myoview and follow-up visit after that. He tested positive for COVID today so this will Lexiscan and follow-up will need to be scheduled for at least 2 weeks out. Lexiscan order has been placed.  Can you please call patient to help arrange this?   Thank you!  Callie

## 2020-01-23 ENCOUNTER — Encounter (HOSPITAL_COMMUNITY): Payer: Self-pay | Admitting: Student

## 2020-02-02 ENCOUNTER — Telehealth (HOSPITAL_COMMUNITY): Payer: Self-pay | Admitting: Student

## 2020-02-02 NOTE — Telephone Encounter (Signed)
Just an FYI. We have made several attempts to contact this patient including sending a letter to schedule or reschedule their Myoview. We will be removing the patient from the echo/NUC WQ.   01/23/2020 MAILED LETTER [facility]  01/20/2020 [facility] to schedule @ 9:26/LBW  01/17/2020 [facility] to schedule @ 10:12/LBW  01/06/20 [facility] to schedule @ 9:36/LBW  01/10/20 9:15am lvm to schedule test AW   Thank you

## 2020-02-03 NOTE — Telephone Encounter (Signed)
Ok, thank you for the update.

## 2020-02-20 ENCOUNTER — Encounter: Payer: Self-pay | Admitting: *Deleted

## 2020-02-21 ENCOUNTER — Ambulatory Visit: Payer: Medicare HMO | Admitting: Cardiology

## 2020-02-21 ENCOUNTER — Encounter: Payer: Self-pay | Admitting: Cardiology

## 2020-02-21 VITALS — BP 140/82 | HR 56 | Ht 68.0 in | Wt 168.0 lb

## 2020-02-21 DIAGNOSIS — I25119 Atherosclerotic heart disease of native coronary artery with unspecified angina pectoris: Secondary | ICD-10-CM | POA: Diagnosis not present

## 2020-02-21 DIAGNOSIS — I6523 Occlusion and stenosis of bilateral carotid arteries: Secondary | ICD-10-CM | POA: Diagnosis not present

## 2020-02-21 DIAGNOSIS — E782 Mixed hyperlipidemia: Secondary | ICD-10-CM | POA: Diagnosis not present

## 2020-02-21 DIAGNOSIS — I10 Essential (primary) hypertension: Secondary | ICD-10-CM

## 2020-02-21 MED ORDER — NITROGLYCERIN 0.4 MG SL SUBL: 0.4000 mg | SUBLINGUAL_TABLET | SUBLINGUAL | 3 refills | Status: AC | PRN

## 2020-02-21 NOTE — Patient Instructions (Addendum)
Your physician wants you to follow-up in: Junior will receive a reminder letter in the mail two months in advance. If you don't receive a letter, please call our office to schedule the follow-up appointment.  Your physician recommends that you continue on your current medications as directed. Please refer to the Current Medication list given to you today.  Your physician recommends that you return for lab work LIPIDS - PLEASE FAST 6-8 HOURS PRIOR TO LAB WORK   Your physician has requested that you have a carotid duplex. This test is an ultrasound of the carotid arteries in your neck. It looks at blood flow through these arteries that supply the brain with blood. Allow one hour for this exam. There are no restrictions or special instructions.    Thank you for choosing [facility]!!

## 2020-02-21 NOTE — Progress Notes (Signed)
Cardiology Office Note  Date: 02/21/2020   ID: KALIQ LEGE, DOB August 11, 1950, MRN [id number]  PCP:  Redmond School, MD  Cardiologist:  Rozann Lesches, MD Electrophysiologist:  None   Chief Complaint  Patient presents with  . Cardiac follow-up    History of Present Illness: Edward Brown is a 69 y.o. male former patient of Dr. Bronson Ing last seen in the office back in 2016.  He is referred for follow-up evaluation by Dr. Gerarda Fraction.  I reviewed the chart, he was just recently seen in consultation by Dr. Audie Box during ER visit in September with chest discomfort.  High-sensitivity troponin I levels were normal at that time and he was not felt to have an ACS.  He was diagnosed with COVID-19 however.  He did not require hospitalization and was discharged home.  Cardiology consultation did recommend considering a follow-up outpatient Lexiscan Myoview.  He presents today stating that he feels well.  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  He reports regular physical activity without recurring chest pain or unusual shortness of breath.  He does have chronic pain and follows with Dr. Gerarda Fraction.  We went over his medications, he does need a refill for fresh bottle of nitroglycerin.  Otherwise reports compliance with aspirin, Lipitor, and Toprol-XL.  I reviewed his most recent lab work from PCP, he has not had a lipid profile.  Past Medical History:  Diagnosis Date  . CAD (coronary artery disease)    Status post aborted inferior myocardial infarction, followed by BMS subtotal proximal RCA January 2007   . Carotid artery disease ([place])    Status post prior bilateral carotid endarterectomy   . Essential hypertension   . Hyperlipidemia     Past Surgical History:  Procedure Laterality Date  . ARCH AORTOGRAM N/A 09/20/2012   Procedure: ARCH AORTOGRAM;  Surgeon: Angelia Mould, MD;  Location: [facility] CATH LAB;  Service: Cardiovascular;  Laterality: N/A;  . CAROTID  ENDARTERECTOMY     Redo; right carotid endarterectomy with replacement of carotid artery with interpostion greater saphenous vein from the left leg  . SPINE SURGERY  Aug. 2012    Current Outpatient Medications  Medication Sig Dispense Refill  . aspirin EC 81 MG tablet Take 1 tablet (81 mg total) by mouth daily.    Marland Kitchen atorvastatin (LIPITOR) 40 MG tablet Take 40 mg by mouth daily.   0  . citalopram (CELEXA) 40 MG tablet Take 1 tablet (40 mg total) by mouth daily.    . metoprolol succinate (TOPROL XL) 25 MG 24 hr tablet Take 0.5 tablets (12.5 mg total) by mouth daily. (Patient taking differently: Take by mouth daily. ) 45 tablet 3  . Oxycodone HCl 10 MG TABS Take 10 mg by mouth 4 (four) times daily as needed (pain).     . nitroGLYCERIN (NITROSTAT) 0.4 MG SL tablet Place 1 tablet (0.4 mg total) under the tongue every 5 (five) minutes as needed for chest pain. 25 tablet 3   No current facility-administered medications for this visit.   Allergies:  Codeine, Hydromorphone hcl, Hydromorphone hcl, Promethazine hcl, and Promethazine hcl   Social History: The patient  reports that he has been smoking cigarettes. He has a 60.00 pack-year smoking history. He has never used smokeless tobacco. He reports current alcohol use. He reports that he does not use drugs.   Family History: The patient's family history includes Coronary artery disease in an other family member; Dementia in  his mother; Heart attack in his father; Heart disease in his father.   ROS: No palpitations or syncope.  Physical Exam: VS:  BP 140/82   Pulse (!) 56   Ht 5\' 8"  (1.727 m)   Wt 168 lb (76.2 kg)   SpO2 92%   BMI 25.54 kg/m , BMI Body mass index is 25.54 kg/m.  Wt Readings from Last 3 Encounters:  02/21/20 168 lb (76.2 kg)  01/05/20 176 lb (79.8 kg)  04/02/16 170 lb (77.1 kg)    General: Patient appears comfortable at rest. HEENT: Conjunctiva and lids normal, wearing a mask. Neck: Supple, no elevated JVP or carotid  bruits, no thyromegaly. Lungs: Decreased breath sounds, nonlabored breathing at rest. Cardiac: Regular rate and rhythm, no S3 or significant systolic murmur, no pericardial rub.  Abdomen: Soft, nontender, bowel sounds present. Extremities: No pitting edema, distal pulses 2+.  ECG:  An ECG dated 01/04/2020 was personally reviewed today and demonstrated:  Sinus rhythm with PACs, left anterior fascicular block, nonspecific ST-T changes.  Recent Labwork: 01/04/2020: BUN 13; Creatinine, Ser 1.01; Hemoglobin 14.3; Platelets 173; Potassium 3.8; Sodium 141  August 2021: Amylase 35, BUN 17, creatinine 0.76, potassium 4.5, hemoglobin 15.3, platelets 197  Other Studies Reviewed Today:  Lexiscan Myoview 02/23/2015:  Findings consistent with prior myocardial infarction.  This is a low risk study.  The left ventricular ejection fraction is mildly decreased (45-54%).   Large inferior wall infarct from apex to base with no ischemia.  EF 51% Given perfusion images would have expected more RWMA on cine images   Echocardiogram 02/23/2015: - Left ventricle: The cavity size was normal. Systolic function was  normal. The estimated ejection fraction was in the range of 60%  to 65%. Wall motion was normal; there were no regional wall  motion abnormalities.  - Left atrium: The atrium was mildly dilated.  - Atrial septum: No defect or patent foramen ovale was identified.   Carotid Dopplers 12/24/2018: IMPRESSION: 1. Stable chronic complete occlusion of the left common, internal and external carotid arteries. 2. Stable possible moderate (50-69%) stenosis of the right internal carotid artery secondary to smooth heterogeneous atherosclerotic plaque. This may be an over estimation of the stenosis due to compensatory increased flow in the setting of a contralateral occlusion.  Chest x-ray 01/04/2020: FINDINGS: There is no evidence of acute infiltrate, pleural effusion or pneumothorax. The heart size  and mediastinal contours are within normal limits. The visualized skeletal structures are unremarkable.  IMPRESSION: No active cardiopulmonary disease.  Assessment and Plan:  1.  CAD with history of BMS to the proximal RCA in 2007.  We discussed symptoms, he does not report any recurrent chest pain since his ER visit in September (when diagnosed with COVID-19, high-sensitivity troponin I levels normal), describes physical activity on a regular basis, no recent nitroglycerin use.  Plan at this point is to continue medical therapy and observation, no strong indication for a follow-up Myoview at this time.  Continue aspirin, Toprol-XL, and Lipitor, prescription provided for fresh bottle of nitroglycerin.  2.  Essential hypertension, systolic pressure 892 today.  He reports compliance with Toprol-XL.  Keep follow-up with Dr. Gerarda Fraction, would probably not further uptitrate Toprol-XL given current heart rate, could consider addition of ARB.  3.  Mixed hyperlipidemia, on Lipitor.  Obtain fasting lipid profile.  4.  Carotid artery disease status post bilateral CEA.  Carotid Dopplers from last year showed moderate RICA stenosis with occluded LICA.  He has not had follow-up with vascular surgery  in quite some time.  Continue aspirin and statin, will obtain follow-up carotid Dopplers.  Medication Adjustments/Labs and Tests Ordered: Current medicines are reviewed at length with the patient today.  Concerns regarding medicines are outlined above.   Tests Ordered: Orders Placed This Encounter  Procedures  . Lipid panel  . VAS US CAROTID    Medication Changes: Meds ordered this encounter  Medications  . nitroGLYCERIN (NITROSTAT) 0.4 MG SL tablet    Sig: Place 1 tablet (0.4 mg total) under the tongue every 5 (five) minutes as needed for chest pain.    Dispense:  25 tablet    Refill:  3    Disposition:  Follow up 6 months in the [place] office.  Signed, Satira Sark, MD, [facility] 02/21/2020 2:13 PM     [facility] [place], [place], [place] [address] Phone: [phone]; Fax: [phone]

## 2020-03-20 DIAGNOSIS — I10 Essential (primary) hypertension: Secondary | ICD-10-CM | POA: Diagnosis not present

## 2020-03-20 DIAGNOSIS — E663 Overweight: Secondary | ICD-10-CM | POA: Diagnosis not present

## 2020-03-20 DIAGNOSIS — H6993 Unspecified Eustachian tube disorder, bilateral: Secondary | ICD-10-CM | POA: Diagnosis not present

## 2020-03-20 DIAGNOSIS — G894 Chronic pain syndrome: Secondary | ICD-10-CM | POA: Diagnosis not present

## 2020-03-20 DIAGNOSIS — Z6826 Body mass index (BMI) 26.0-26.9, adult: Secondary | ICD-10-CM | POA: Diagnosis not present

## 2020-06-07 DIAGNOSIS — M2550 Pain in unspecified joint: Secondary | ICD-10-CM | POA: Diagnosis not present

## 2020-06-07 DIAGNOSIS — Z0001 Encounter for general adult medical examination with abnormal findings: Secondary | ICD-10-CM | POA: Diagnosis not present

## 2020-06-07 DIAGNOSIS — I10 Essential (primary) hypertension: Secondary | ICD-10-CM | POA: Diagnosis not present

## 2020-06-07 DIAGNOSIS — E063 Autoimmune thyroiditis: Secondary | ICD-10-CM | POA: Diagnosis not present

## 2020-06-07 DIAGNOSIS — M779 Enthesopathy, unspecified: Secondary | ICD-10-CM | POA: Diagnosis not present

## 2020-06-07 DIAGNOSIS — Z79899 Other long term (current) drug therapy: Secondary | ICD-10-CM | POA: Diagnosis not present

## 2020-12-28 DIAGNOSIS — I10 Essential (primary) hypertension: Secondary | ICD-10-CM | POA: Diagnosis not present

## 2020-12-28 DIAGNOSIS — M064 Inflammatory polyarthropathy: Secondary | ICD-10-CM | POA: Diagnosis not present

## 2020-12-28 DIAGNOSIS — M1991 Primary osteoarthritis, unspecified site: Secondary | ICD-10-CM | POA: Diagnosis not present

## 2021-02-11 DIAGNOSIS — E782 Mixed hyperlipidemia: Secondary | ICD-10-CM | POA: Diagnosis not present

## 2021-02-11 DIAGNOSIS — D72829 Elevated white blood cell count, unspecified: Secondary | ICD-10-CM | POA: Diagnosis not present

## 2021-02-11 DIAGNOSIS — M1991 Primary osteoarthritis, unspecified site: Secondary | ICD-10-CM | POA: Diagnosis not present

## 2021-02-11 DIAGNOSIS — G8929 Other chronic pain: Secondary | ICD-10-CM | POA: Diagnosis not present

## 2021-02-11 DIAGNOSIS — I10 Essential (primary) hypertension: Secondary | ICD-10-CM | POA: Diagnosis not present

## 2021-02-22 DIAGNOSIS — L03211 Cellulitis of face: Secondary | ICD-10-CM | POA: Diagnosis not present

## 2021-02-22 DIAGNOSIS — F17200 Nicotine dependence, unspecified, uncomplicated: Secondary | ICD-10-CM | POA: Diagnosis not present

## 2021-02-22 DIAGNOSIS — S025XXG Fracture of tooth (traumatic), subsequent encounter for fracture with delayed healing: Secondary | ICD-10-CM | POA: Diagnosis not present

## 2021-02-22 DIAGNOSIS — Z888 Allergy status to other drugs, medicaments and biological substances status: Secondary | ICD-10-CM | POA: Diagnosis not present

## 2021-02-22 DIAGNOSIS — K0889 Other specified disorders of teeth and supporting structures: Secondary | ICD-10-CM | POA: Diagnosis not present

## 2021-02-22 DIAGNOSIS — I252 Old myocardial infarction: Secondary | ICD-10-CM | POA: Diagnosis not present

## 2021-02-22 DIAGNOSIS — Z885 Allergy status to narcotic agent status: Secondary | ICD-10-CM | POA: Diagnosis not present

## 2021-02-22 DIAGNOSIS — K029 Dental caries, unspecified: Secondary | ICD-10-CM | POA: Diagnosis not present

## 2021-05-27 DIAGNOSIS — M75102 Unspecified rotator cuff tear or rupture of left shoulder, not specified as traumatic: Secondary | ICD-10-CM | POA: Diagnosis not present

## 2021-05-27 DIAGNOSIS — M1991 Primary osteoarthritis, unspecified site: Secondary | ICD-10-CM | POA: Diagnosis not present

## 2021-05-27 DIAGNOSIS — M75101 Unspecified rotator cuff tear or rupture of right shoulder, not specified as traumatic: Secondary | ICD-10-CM | POA: Diagnosis not present

## 2021-05-27 DIAGNOSIS — G894 Chronic pain syndrome: Secondary | ICD-10-CM | POA: Diagnosis not present

## 2021-05-27 DIAGNOSIS — I10 Essential (primary) hypertension: Secondary | ICD-10-CM | POA: Diagnosis not present

## 2021-07-31 DIAGNOSIS — E663 Overweight: Secondary | ICD-10-CM | POA: Diagnosis not present

## 2021-07-31 DIAGNOSIS — M75101 Unspecified rotator cuff tear or rupture of right shoulder, not specified as traumatic: Secondary | ICD-10-CM | POA: Diagnosis not present

## 2021-07-31 DIAGNOSIS — M1991 Primary osteoarthritis, unspecified site: Secondary | ICD-10-CM | POA: Diagnosis not present

## 2021-07-31 DIAGNOSIS — Z6825 Body mass index (BMI) 25.0-25.9, adult: Secondary | ICD-10-CM | POA: Diagnosis not present

## 2021-07-31 DIAGNOSIS — G894 Chronic pain syndrome: Secondary | ICD-10-CM | POA: Diagnosis not present

## 2021-08-14 DIAGNOSIS — Z20822 Contact with and (suspected) exposure to covid-19: Secondary | ICD-10-CM | POA: Diagnosis not present

## 2021-08-14 DIAGNOSIS — J209 Acute bronchitis, unspecified: Secondary | ICD-10-CM | POA: Diagnosis not present

## 2021-08-29 DIAGNOSIS — M19019 Primary osteoarthritis, unspecified shoulder: Secondary | ICD-10-CM | POA: Diagnosis not present

## 2021-08-29 DIAGNOSIS — B86 Scabies: Secondary | ICD-10-CM | POA: Diagnosis not present

## 2021-10-14 DIAGNOSIS — L308 Other specified dermatitis: Secondary | ICD-10-CM | POA: Diagnosis not present

## 2021-12-23 DIAGNOSIS — M1991 Primary osteoarthritis, unspecified site: Secondary | ICD-10-CM | POA: Diagnosis not present

## 2021-12-23 DIAGNOSIS — Z0001 Encounter for general adult medical examination with abnormal findings: Secondary | ICD-10-CM | POA: Diagnosis not present

## 2021-12-23 DIAGNOSIS — D690 Allergic purpura: Secondary | ICD-10-CM | POA: Diagnosis not present

## 2021-12-23 DIAGNOSIS — G894 Chronic pain syndrome: Secondary | ICD-10-CM | POA: Diagnosis not present

## 2021-12-23 DIAGNOSIS — E039 Hypothyroidism, unspecified: Secondary | ICD-10-CM | POA: Diagnosis not present

## 2021-12-23 DIAGNOSIS — E559 Vitamin D deficiency, unspecified: Secondary | ICD-10-CM | POA: Diagnosis not present

## 2021-12-23 DIAGNOSIS — Z125 Encounter for screening for malignant neoplasm of prostate: Secondary | ICD-10-CM | POA: Diagnosis not present

## 2021-12-23 DIAGNOSIS — Z6824 Body mass index (BMI) 24.0-24.9, adult: Secondary | ICD-10-CM | POA: Diagnosis not present

## 2021-12-23 DIAGNOSIS — I10 Essential (primary) hypertension: Secondary | ICD-10-CM | POA: Diagnosis not present

## 2021-12-23 DIAGNOSIS — Z1331 Encounter for screening for depression: Secondary | ICD-10-CM | POA: Diagnosis not present

## 2021-12-23 DIAGNOSIS — D518 Other vitamin B12 deficiency anemias: Secondary | ICD-10-CM | POA: Diagnosis not present

## 2021-12-23 DIAGNOSIS — I2510 Atherosclerotic heart disease of native coronary artery without angina pectoris: Secondary | ICD-10-CM | POA: Diagnosis not present

## 2022-02-21 DIAGNOSIS — I252 Old myocardial infarction: Secondary | ICD-10-CM | POA: Diagnosis not present

## 2022-02-21 DIAGNOSIS — F17200 Nicotine dependence, unspecified, uncomplicated: Secondary | ICD-10-CM | POA: Diagnosis not present

## 2022-02-21 DIAGNOSIS — T782XXA Anaphylactic shock, unspecified, initial encounter: Secondary | ICD-10-CM | POA: Diagnosis not present

## 2022-02-21 DIAGNOSIS — Z7289 Other problems related to lifestyle: Secondary | ICD-10-CM | POA: Diagnosis not present

## 2022-05-12 DIAGNOSIS — I2510 Atherosclerotic heart disease of native coronary artery without angina pectoris: Secondary | ICD-10-CM | POA: Diagnosis not present

## 2022-05-12 DIAGNOSIS — M1991 Primary osteoarthritis, unspecified site: Secondary | ICD-10-CM | POA: Diagnosis not present

## 2022-05-12 DIAGNOSIS — G894 Chronic pain syndrome: Secondary | ICD-10-CM | POA: Diagnosis not present

## 2022-05-12 DIAGNOSIS — I10 Essential (primary) hypertension: Secondary | ICD-10-CM | POA: Diagnosis not present

## 2022-05-12 DIAGNOSIS — Z6823 Body mass index (BMI) 23.0-23.9, adult: Secondary | ICD-10-CM | POA: Diagnosis not present

## 2022-07-23 DIAGNOSIS — Z6826 Body mass index (BMI) 26.0-26.9, adult: Secondary | ICD-10-CM | POA: Diagnosis not present

## 2022-07-23 DIAGNOSIS — I10 Essential (primary) hypertension: Secondary | ICD-10-CM | POA: Diagnosis not present

## 2022-07-23 DIAGNOSIS — G894 Chronic pain syndrome: Secondary | ICD-10-CM | POA: Diagnosis not present

## 2022-07-23 DIAGNOSIS — H6503 Acute serous otitis media, bilateral: Secondary | ICD-10-CM | POA: Diagnosis not present

## 2022-07-23 DIAGNOSIS — E663 Overweight: Secondary | ICD-10-CM | POA: Diagnosis not present

## 2022-07-23 DIAGNOSIS — M1991 Primary osteoarthritis, unspecified site: Secondary | ICD-10-CM | POA: Diagnosis not present

## 2022-10-15 DIAGNOSIS — Z6825 Body mass index (BMI) 25.0-25.9, adult: Secondary | ICD-10-CM | POA: Diagnosis not present

## 2022-10-15 DIAGNOSIS — W57XXXA Bitten or stung by nonvenomous insect and other nonvenomous arthropods, initial encounter: Secondary | ICD-10-CM | POA: Diagnosis not present

## 2022-10-15 DIAGNOSIS — M1991 Primary osteoarthritis, unspecified site: Secondary | ICD-10-CM | POA: Diagnosis not present

## 2022-10-15 DIAGNOSIS — I10 Essential (primary) hypertension: Secondary | ICD-10-CM | POA: Diagnosis not present

## 2022-10-15 DIAGNOSIS — G894 Chronic pain syndrome: Secondary | ICD-10-CM | POA: Diagnosis not present

## 2022-10-15 DIAGNOSIS — I2510 Atherosclerotic heart disease of native coronary artery without angina pectoris: Secondary | ICD-10-CM | POA: Diagnosis not present

## 2022-12-09 DIAGNOSIS — L259 Unspecified contact dermatitis, unspecified cause: Secondary | ICD-10-CM | POA: Diagnosis not present

## 2022-12-09 DIAGNOSIS — R233 Spontaneous ecchymoses: Secondary | ICD-10-CM | POA: Diagnosis not present

## 2022-12-09 DIAGNOSIS — L309 Dermatitis, unspecified: Secondary | ICD-10-CM | POA: Diagnosis not present

## 2022-12-09 DIAGNOSIS — L821 Other seborrheic keratosis: Secondary | ICD-10-CM | POA: Diagnosis not present

## 2023-02-24 DIAGNOSIS — S62634A Displaced fracture of distal phalanx of right ring finger, initial encounter for closed fracture: Secondary | ICD-10-CM | POA: Diagnosis not present

## 2023-02-24 DIAGNOSIS — Z5321 Procedure and treatment not carried out due to patient leaving prior to being seen by health care provider: Secondary | ICD-10-CM | POA: Diagnosis not present

## 2023-02-24 DIAGNOSIS — W230XXA Caught, crushed, jammed, or pinched between moving objects, initial encounter: Secondary | ICD-10-CM | POA: Diagnosis not present

## 2023-02-24 DIAGNOSIS — S62639A Displaced fracture of distal phalanx of unspecified finger, initial encounter for closed fracture: Secondary | ICD-10-CM | POA: Diagnosis not present

## 2023-02-24 DIAGNOSIS — S61214A Laceration without foreign body of right ring finger without damage to nail, initial encounter: Secondary | ICD-10-CM | POA: Diagnosis not present

## 2023-02-24 DIAGNOSIS — M79644 Pain in right finger(s): Secondary | ICD-10-CM | POA: Diagnosis not present

## 2023-02-27 DIAGNOSIS — G894 Chronic pain syndrome: Secondary | ICD-10-CM | POA: Diagnosis not present

## 2023-02-27 DIAGNOSIS — M1991 Primary osteoarthritis, unspecified site: Secondary | ICD-10-CM | POA: Diagnosis not present

## 2023-02-27 DIAGNOSIS — I2510 Atherosclerotic heart disease of native coronary artery without angina pectoris: Secondary | ICD-10-CM | POA: Diagnosis not present

## 2023-02-27 DIAGNOSIS — Z6824 Body mass index (BMI) 24.0-24.9, adult: Secondary | ICD-10-CM | POA: Diagnosis not present

## 2023-02-27 DIAGNOSIS — S62608B Fracture of unspecified phalanx of other finger, initial encounter for open fracture: Secondary | ICD-10-CM | POA: Diagnosis not present

## 2023-02-27 DIAGNOSIS — M75102 Unspecified rotator cuff tear or rupture of left shoulder, not specified as traumatic: Secondary | ICD-10-CM | POA: Diagnosis not present

## 2023-02-27 DIAGNOSIS — I10 Essential (primary) hypertension: Secondary | ICD-10-CM | POA: Diagnosis not present

## 2023-03-05 DIAGNOSIS — G894 Chronic pain syndrome: Secondary | ICD-10-CM | POA: Diagnosis not present

## 2023-03-05 DIAGNOSIS — S62600D Fracture of unspecified phalanx of right index finger, subsequent encounter for fracture with routine healing: Secondary | ICD-10-CM | POA: Diagnosis not present

## 2023-03-05 DIAGNOSIS — I10 Essential (primary) hypertension: Secondary | ICD-10-CM | POA: Diagnosis not present

## 2023-03-05 DIAGNOSIS — Z6823 Body mass index (BMI) 23.0-23.9, adult: Secondary | ICD-10-CM | POA: Diagnosis not present

## 2023-03-21 DIAGNOSIS — R0600 Dyspnea, unspecified: Secondary | ICD-10-CM | POA: Diagnosis not present

## 2023-03-21 DIAGNOSIS — Z20822 Contact with and (suspected) exposure to covid-19: Secondary | ICD-10-CM | POA: Diagnosis not present

## 2023-03-21 DIAGNOSIS — Z87891 Personal history of nicotine dependence: Secondary | ICD-10-CM | POA: Diagnosis not present

## 2023-03-21 DIAGNOSIS — J40 Bronchitis, not specified as acute or chronic: Secondary | ICD-10-CM | POA: Diagnosis not present

## 2023-03-21 DIAGNOSIS — Z8673 Personal history of transient ischemic attack (TIA), and cerebral infarction without residual deficits: Secondary | ICD-10-CM | POA: Diagnosis not present

## 2023-04-22 DIAGNOSIS — J45909 Unspecified asthma, uncomplicated: Secondary | ICD-10-CM | POA: Diagnosis not present

## 2023-04-22 DIAGNOSIS — G894 Chronic pain syndrome: Secondary | ICD-10-CM | POA: Diagnosis not present

## 2023-04-22 DIAGNOSIS — M1991 Primary osteoarthritis, unspecified site: Secondary | ICD-10-CM | POA: Diagnosis not present

## 2023-04-22 DIAGNOSIS — Z6824 Body mass index (BMI) 24.0-24.9, adult: Secondary | ICD-10-CM | POA: Diagnosis not present

## 2023-04-22 DIAGNOSIS — I10 Essential (primary) hypertension: Secondary | ICD-10-CM | POA: Diagnosis not present

## 2023-04-22 DIAGNOSIS — D690 Allergic purpura: Secondary | ICD-10-CM | POA: Diagnosis not present

## 2023-06-10 DIAGNOSIS — Z9229 Personal history of other drug therapy: Secondary | ICD-10-CM | POA: Diagnosis not present

## 2023-06-10 DIAGNOSIS — Z125 Encounter for screening for malignant neoplasm of prostate: Secondary | ICD-10-CM | POA: Diagnosis not present

## 2023-06-10 DIAGNOSIS — M1991 Primary osteoarthritis, unspecified site: Secondary | ICD-10-CM | POA: Diagnosis not present

## 2023-06-10 DIAGNOSIS — E782 Mixed hyperlipidemia: Secondary | ICD-10-CM | POA: Diagnosis not present

## 2023-06-10 DIAGNOSIS — E559 Vitamin D deficiency, unspecified: Secondary | ICD-10-CM | POA: Diagnosis not present

## 2023-06-10 DIAGNOSIS — E7849 Other hyperlipidemia: Secondary | ICD-10-CM | POA: Diagnosis not present

## 2023-06-10 DIAGNOSIS — Z6824 Body mass index (BMI) 24.0-24.9, adult: Secondary | ICD-10-CM | POA: Diagnosis not present

## 2023-06-10 DIAGNOSIS — N401 Enlarged prostate with lower urinary tract symptoms: Secondary | ICD-10-CM | POA: Diagnosis not present

## 2023-06-10 DIAGNOSIS — G894 Chronic pain syndrome: Secondary | ICD-10-CM | POA: Diagnosis not present

## 2023-06-10 DIAGNOSIS — Z1331 Encounter for screening for depression: Secondary | ICD-10-CM | POA: Diagnosis not present

## 2023-06-10 DIAGNOSIS — I10 Essential (primary) hypertension: Secondary | ICD-10-CM | POA: Diagnosis not present

## 2023-06-10 DIAGNOSIS — R7309 Other abnormal glucose: Secondary | ICD-10-CM | POA: Diagnosis not present

## 2023-06-10 DIAGNOSIS — Z0001 Encounter for general adult medical examination with abnormal findings: Secondary | ICD-10-CM | POA: Diagnosis not present

## 2023-07-28 DIAGNOSIS — J069 Acute upper respiratory infection, unspecified: Secondary | ICD-10-CM | POA: Diagnosis not present

## 2023-07-28 DIAGNOSIS — E663 Overweight: Secondary | ICD-10-CM | POA: Diagnosis not present

## 2023-07-28 DIAGNOSIS — Z20828 Contact with and (suspected) exposure to other viral communicable diseases: Secondary | ICD-10-CM | POA: Diagnosis not present

## 2023-07-28 DIAGNOSIS — Z6825 Body mass index (BMI) 25.0-25.9, adult: Secondary | ICD-10-CM | POA: Diagnosis not present

## 2023-07-28 DIAGNOSIS — R6889 Other general symptoms and signs: Secondary | ICD-10-CM | POA: Diagnosis not present

## 2023-07-28 DIAGNOSIS — J439 Emphysema, unspecified: Secondary | ICD-10-CM | POA: Diagnosis not present

## 2023-08-11 DIAGNOSIS — M1991 Primary osteoarthritis, unspecified site: Secondary | ICD-10-CM | POA: Diagnosis not present

## 2023-08-11 DIAGNOSIS — M159 Polyosteoarthritis, unspecified: Secondary | ICD-10-CM | POA: Diagnosis not present

## 2023-08-11 DIAGNOSIS — Z6825 Body mass index (BMI) 25.0-25.9, adult: Secondary | ICD-10-CM | POA: Diagnosis not present

## 2023-08-11 DIAGNOSIS — I10 Essential (primary) hypertension: Secondary | ICD-10-CM | POA: Diagnosis not present

## 2023-08-11 DIAGNOSIS — J45909 Unspecified asthma, uncomplicated: Secondary | ICD-10-CM | POA: Diagnosis not present

## 2023-08-11 DIAGNOSIS — E663 Overweight: Secondary | ICD-10-CM | POA: Diagnosis not present

## 2023-08-11 DIAGNOSIS — J439 Emphysema, unspecified: Secondary | ICD-10-CM | POA: Diagnosis not present

## 2023-08-11 DIAGNOSIS — M47816 Spondylosis without myelopathy or radiculopathy, lumbar region: Secondary | ICD-10-CM | POA: Diagnosis not present

## 2023-08-11 DIAGNOSIS — G894 Chronic pain syndrome: Secondary | ICD-10-CM | POA: Diagnosis not present

## 2023-09-09 DIAGNOSIS — M47816 Spondylosis without myelopathy or radiculopathy, lumbar region: Secondary | ICD-10-CM | POA: Diagnosis not present

## 2023-09-09 DIAGNOSIS — J209 Acute bronchitis, unspecified: Secondary | ICD-10-CM | POA: Diagnosis not present

## 2023-09-09 DIAGNOSIS — M1991 Primary osteoarthritis, unspecified site: Secondary | ICD-10-CM | POA: Diagnosis not present

## 2023-09-09 DIAGNOSIS — G894 Chronic pain syndrome: Secondary | ICD-10-CM | POA: Diagnosis not present

## 2023-09-09 DIAGNOSIS — I10 Essential (primary) hypertension: Secondary | ICD-10-CM | POA: Diagnosis not present

## 2023-09-09 DIAGNOSIS — Z6823 Body mass index (BMI) 23.0-23.9, adult: Secondary | ICD-10-CM | POA: Diagnosis not present

## 2023-09-09 DIAGNOSIS — J439 Emphysema, unspecified: Secondary | ICD-10-CM | POA: Diagnosis not present

## 2023-11-05 DIAGNOSIS — M1991 Primary osteoarthritis, unspecified site: Secondary | ICD-10-CM | POA: Diagnosis not present

## 2023-11-05 DIAGNOSIS — I10 Essential (primary) hypertension: Secondary | ICD-10-CM | POA: Diagnosis not present

## 2023-11-05 DIAGNOSIS — M47816 Spondylosis without myelopathy or radiculopathy, lumbar region: Secondary | ICD-10-CM | POA: Diagnosis not present

## 2023-11-05 DIAGNOSIS — M159 Polyosteoarthritis, unspecified: Secondary | ICD-10-CM | POA: Diagnosis not present

## 2023-11-05 DIAGNOSIS — G894 Chronic pain syndrome: Secondary | ICD-10-CM | POA: Diagnosis not present

## 2023-11-05 DIAGNOSIS — H6123 Impacted cerumen, bilateral: Secondary | ICD-10-CM | POA: Diagnosis not present

## 2023-11-05 DIAGNOSIS — J439 Emphysema, unspecified: Secondary | ICD-10-CM | POA: Diagnosis not present

## 2023-11-05 DIAGNOSIS — H6590 Unspecified nonsuppurative otitis media, unspecified ear: Secondary | ICD-10-CM | POA: Diagnosis not present

## 2023-11-05 DIAGNOSIS — Z6823 Body mass index (BMI) 23.0-23.9, adult: Secondary | ICD-10-CM | POA: Diagnosis not present

## 2023-11-05 DIAGNOSIS — H6993 Unspecified Eustachian tube disorder, bilateral: Secondary | ICD-10-CM | POA: Diagnosis not present

## 2023-12-28 DIAGNOSIS — M47816 Spondylosis without myelopathy or radiculopathy, lumbar region: Secondary | ICD-10-CM | POA: Diagnosis not present

## 2023-12-28 DIAGNOSIS — G894 Chronic pain syndrome: Secondary | ICD-10-CM | POA: Diagnosis not present

## 2023-12-28 DIAGNOSIS — M1991 Primary osteoarthritis, unspecified site: Secondary | ICD-10-CM | POA: Diagnosis not present

## 2023-12-28 DIAGNOSIS — J439 Emphysema, unspecified: Secondary | ICD-10-CM | POA: Diagnosis not present

## 2023-12-28 DIAGNOSIS — M75102 Unspecified rotator cuff tear or rupture of left shoulder, not specified as traumatic: Secondary | ICD-10-CM | POA: Diagnosis not present

## 2023-12-28 DIAGNOSIS — I10 Essential (primary) hypertension: Secondary | ICD-10-CM | POA: Diagnosis not present

## 2023-12-28 DIAGNOSIS — Z6823 Body mass index (BMI) 23.0-23.9, adult: Secondary | ICD-10-CM | POA: Diagnosis not present

## 2024-05-09 ENCOUNTER — Ambulatory Visit: Attending: Internal Medicine | Admitting: Internal Medicine

## 2024-05-09 ENCOUNTER — Encounter: Payer: Self-pay | Admitting: *Deleted

## 2024-05-09 ENCOUNTER — Encounter: Payer: Self-pay | Admitting: Internal Medicine

## 2024-05-09 VITALS — BP 80/40 | Ht 68.0 in | Wt 157.6 lb

## 2024-05-09 DIAGNOSIS — I959 Hypotension, unspecified: Secondary | ICD-10-CM | POA: Insufficient documentation

## 2024-05-09 DIAGNOSIS — I6523 Occlusion and stenosis of bilateral carotid arteries: Secondary | ICD-10-CM

## 2024-05-09 DIAGNOSIS — I952 Hypotension due to drugs: Secondary | ICD-10-CM

## 2024-05-09 MED ORDER — REPATHA SURECLICK 140 MG/ML ~~LOC~~ SOAJ: 140.0000 mg | SUBCUTANEOUS | 5 refills | Status: AC

## 2024-05-09 MED ORDER — ASPIRIN EC 81 MG PO TBEC: 81.0000 mg | DELAYED_RELEASE_TABLET | Freq: Every day | ORAL | 3 refills | Status: AC

## 2024-05-09 MED ORDER — LISINOPRIL 5 MG PO TABS: 5.0000 mg | ORAL_TABLET | Freq: Every day | ORAL | 3 refills | Status: AC

## 2024-05-09 NOTE — Patient Instructions (Addendum)
 Medication Instructions:  Your physician has recommended you make the following change in your medication:  Restart aspirin  81 mg daily Decrease lisinopril  to 5 mg daily Start Repatha  140 mg every 2 weeks Continue all other medications as prescribed  Labwork: none  Testing/Procedures: Your physician has requested that you have a carotid duplex. This test is an ultrasound of the carotid arteries in your neck. It looks at blood flow through these arteries that supply the brain with blood. Allow one hour for this exam. There are no restrictions or special instructions.  Follow-Up: Your physician recommends that you schedule a follow-up appointment in: 6 months  Any Other Special Instructions Will Be Listed Below (If Applicable).  If you need a refill on your cardiac medications before your next appointment, please call your pharmacy.

## 2024-05-09 NOTE — Progress Notes (Signed)
 "    Cardiology Office Note  Date: 05/09/2024   ID: Edward Brown, DOB [birth year], MRN [id number]  PCP:  No primary care provider on file.  Cardiologist:  Diannah SHAUNNA Maywood, MD Electrophysiologist:  None   History of Present Illness: Edward Brown is a 74 y.o. male known to have CAD manifested by inferior MI s/p RCA BMS in 2007, complicated carotid artery interventions with CTO of the left carotid system and residual moderate right carotid stenosis HTN, HLD, is here to establish care.  Does not do much.  No angina or DOE.  BP today is 80 mm SBP.  Denies having any dizziness, lightheadedness, syncope.  No palpitations, leg swelling.  Doing great overall.  He does not take aspirin .  He takes aspirin  only when he remembers.  He does not like to take medications.  Past Medical History:  Diagnosis Date   CAD (coronary artery disease)    Status post aborted inferior myocardial infarction, followed by BMS subtotal proximal RCA January 2007    Carotid artery disease    Status post prior bilateral carotid endarterectomy    Essential hypertension    Hyperlipidemia     Past Surgical History:  Procedure Laterality Date   ARCH AORTOGRAM N/A 09/20/2012   Procedure: ARCH AORTOGRAM;  Surgeon: Lonni GORMAN Blade, MD;  Location: [facility] CATH LAB;  Service: Cardiovascular;  Laterality: N/A;   CAROTID ENDARTERECTOMY     Redo; right carotid endarterectomy with replacement of carotid artery with interpostion greater saphenous vein from the left leg   SPINE SURGERY  Aug. 2012    Current Outpatient Medications  Medication Sig Dispense Refill   atorvastatin  (LIPITOR) 40 MG tablet Take 40 mg by mouth daily.   0   citalopram  (CELEXA ) 40 MG tablet Take 1 tablet (40 mg total) by mouth daily.     metoprolol  succinate (TOPROL  XL) 25 MG 24 hr tablet Take 0.5 tablets (12.5 mg total) by mouth daily. (Patient taking differently: Take 25 mg by mouth daily.) 45 tablet 3   nitroGLYCERIN  (NITROSTAT ) 0.4 MG SL tablet  Place 1 tablet (0.4 mg total) under the tongue every 5 (five) minutes as needed for chest pain. 25 tablet 3   aspirin  EC 81 MG tablet Take 1 tablet (81 mg total) by mouth daily. (Patient not taking: Reported on 05/09/2024)     Oxycodone  HCl 10 MG TABS Take 10 mg by mouth 4 (four) times daily as needed (pain).  (Patient not taking: Reported on 05/09/2024)     No current facility-administered medications for this visit.   Allergies:  Codeine, Hydromorphone hcl, Hydromorphone hcl, Promethazine hcl, and Promethazine hcl   Social History: The patient  reports that he has been smoking cigarettes. He started smoking about 44 years ago. He has a 60 pack-year smoking history. He has never used smokeless tobacco. He reports current alcohol use. He reports that he does not use drugs.   Family History: The patient's family history includes Coronary artery disease in an other family member; Dementia in his mother; Heart attack in his father; Heart disease in his father.   ROS:  Please see the history of present illness. Otherwise, complete review of systems is positive for none  All other systems are reviewed and negative.   Physical Exam: VS:  Ht 5' 8 (1.727 m)   Wt 157 lb 9.6 oz (71.5 kg)   BMI 23.96 kg/m , BMI Body mass index is 23.96 kg/m.  Wt Readings from Last 3 Encounters:  05/09/24 157 lb 9.6 oz (71.5 kg)  02/21/20 168 lb (76.2 kg)  01/05/20 176 lb (79.8 kg)    General: Patient appears comfortable at rest. HEENT: Conjunctiva and lids normal, oropharynx clear with moist mucosa. Neck: Supple, no elevated JVP or carotid bruits, no thyromegaly. Lungs: Clear to auscultation, nonlabored breathing at rest. Cardiac: Regular rate and rhythm, no S3 or significant systolic murmur, no pericardial rub. Abdomen: Soft, nontender, no hepatomegaly, bowel sounds present, no guarding or rebound. Extremities: No pitting edema, distal pulses 2+. Skin: Warm and dry. Musculoskeletal: No  kyphosis. Neuropsychiatric: Alert and oriented x3, affect grossly appropriate.  Recent Labwork: No results found for requested labs within last 365 days.     Component Value Date/Time   CHOL  10/03/2008 0210    183        ATP III CLASSIFICATION:  <200     mg/dL   Desirable  799-760  mg/dL   Borderline High  >=759    mg/dL   High          TRIG 897 10/03/2008 0210   HDL 26 (L) 10/03/2008 0210   CHOLHDL 7.0 10/03/2008 0210   VLDL 20 10/03/2008 0210   LDLCALC (H) 10/03/2008 0210    137        Total Cholesterol/HDL:CHD Risk Coronary Heart Disease Risk Table                     Men   Women  1/2 Average Risk   3.4   3.3  Average Risk       5.0   4.4  2 X Average Risk   9.6   7.1  3 X Average Risk  23.4   11.0        Use the calculated Patient Ratio above and the CHD Risk Table to determine the patient's CHD Risk.        ATP III CLASSIFICATION (LDL):  <100     mg/dL   Optimal  899-870  mg/dL   Near or Above                    Optimal  130-159  mg/dL   Borderline  839-810  mg/dL   High  >809     mg/dL   Very High    Assessment and Plan:  CAD s/p RCA BMS - No angina or DOE.  NST from 2016 showed a large inferior wall infarct from apex to base with no evidence of ischemia and LVEF 51%. - Resume aspirin  81 mg once daily, continue atorvastatin  40 mg nightly.  Start Repatha . - Discussed symptoms of CAD and MI with the patient today. - ER precautions for chest pain provided.  Peripheral artery disease Carotid artery disease - s/p staged bilateral carotid endarterectomies in 2005.  Subsequently he was found to have 80% R ICA stenosis and occluded L ICA in 2010.  He underwent redo right carotid endarterectomy c/w high dissection s/p interposition vein graft in the right carotid system with chronic occlusion of the left carotid system. - Ultrasound carotid Doppler in 2020 showed stable chronic complete occlusion of the left common carotid, internal carotid and external carotid  arteries; stable moderate (50 to 69%) stenosis of the R ICA. - Repeat ultrasound carotid Doppler bilateral. - Resume aspirin  81 mg once daily, continue atorvastatin  40 mg nightly. - Smoking cessation counseling provided. - He also reports having claudication especially when he walks uphill but this occurs after walking for a  long time.  Will continue to monitor symptoms and obtain ABIs in the future.  HLD, not at goal - Lipid panel from 06/2023 reviewed.  LDL 91, TG 116.  Goal LDL less than 55.  Continue atorvastatin  40 mg nightly.  Start Repatha .  HTN c/w hypotension - BP today is 80/40 mmHg.  Asymptomatic.  Decrease the dose of lisinopril  from 10 mg to 5 mg once daily.  Nicotine abuse - Current smoker.  1.5 packs per day. Counseling provided.  45 minutes spent in reviewing prior medical records, reports, more than 3 labs, discussion and documentation.     Medication Adjustments/Labs and Tests Ordered: Current medicines are reviewed at length with the patient today.  Concerns regarding medicines are outlined above.    Disposition:  Follow up 6 months  Signed Lynzee Lindquist Priya Kataleia Quaranta, MD, 05/09/2024 1:56 PM    [facility] [address] [place], [place], [place] [address]  "

## 2024-05-13 ENCOUNTER — Other Ambulatory Visit (HOSPITAL_BASED_OUTPATIENT_CLINIC_OR_DEPARTMENT_OTHER): Payer: Self-pay

## 2024-05-24 ENCOUNTER — Ambulatory Visit: Attending: Internal Medicine

## 2024-05-24 DIAGNOSIS — I6523 Occlusion and stenosis of bilateral carotid arteries: Secondary | ICD-10-CM

## 2024-06-08 ENCOUNTER — Ambulatory Visit: Payer: Self-pay | Admitting: Internal Medicine

## 2024-06-08 DIAGNOSIS — I771 Stricture of artery: Secondary | ICD-10-CM

## 2024-06-08 NOTE — Telephone Encounter (Signed)
-----   Message from Vishnu Mallipeddi, MD sent at 06/08/2024  3:41 PM EST ----- 40 to 59% stenosis in R ICA and complete occlusion of L ICA.  Stable carotid artery findings compared to prior studies.  Left subclavian artery flow was disturbed.  Obtain ultrasound arterial Doppler  of left upper extremity and clavicle (in the comments, please add that left subclavian artery needs to be scanned).

## 2024-06-08 NOTE — Telephone Encounter (Signed)
 The patient has been notified of the result and verbalized understanding.  All questions (if any) were answered. Patient is aware he will be called to schedule an appointment. Edward Brown, CMA 06/08/2024 4:56 PM

## 2024-06-28 ENCOUNTER — Ambulatory Visit
